# Patient Record
Sex: Female | Born: 1935 | Race: White | Hispanic: No | Smoking: Never smoker
Health system: Southern US, Community
[De-identification: ages and names within clinical notes are randomized; demographics above are authoritative.]

## PROBLEM LIST (undated history)

## (undated) DIAGNOSIS — E7800 Pure hypercholesterolemia, unspecified: Secondary | ICD-10-CM

## (undated) DIAGNOSIS — E039 Hypothyroidism, unspecified: Secondary | ICD-10-CM

## (undated) DIAGNOSIS — M1990 Unspecified osteoarthritis, unspecified site: Secondary | ICD-10-CM

## (undated) DIAGNOSIS — R450 Nervousness: Secondary | ICD-10-CM

## (undated) DIAGNOSIS — C4339 Malignant melanoma of other parts of face: Secondary | ICD-10-CM

## (undated) DIAGNOSIS — T8859XA Other complications of anesthesia, initial encounter: Secondary | ICD-10-CM

## (undated) DIAGNOSIS — L659 Nonscarring hair loss, unspecified: Secondary | ICD-10-CM

## (undated) DIAGNOSIS — M810 Age-related osteoporosis without current pathological fracture: Secondary | ICD-10-CM

## (undated) DIAGNOSIS — I10 Essential (primary) hypertension: Secondary | ICD-10-CM

## (undated) DIAGNOSIS — K219 Gastro-esophageal reflux disease without esophagitis: Secondary | ICD-10-CM

## (undated) DIAGNOSIS — H3530 Unspecified macular degeneration: Secondary | ICD-10-CM

## (undated) DIAGNOSIS — T4145XA Adverse effect of unspecified anesthetic, initial encounter: Secondary | ICD-10-CM

## (undated) DIAGNOSIS — C801 Malignant (primary) neoplasm, unspecified: Secondary | ICD-10-CM

## (undated) DIAGNOSIS — H9190 Unspecified hearing loss, unspecified ear: Secondary | ICD-10-CM

## (undated) DIAGNOSIS — F0390 Unspecified dementia without behavioral disturbance: Secondary | ICD-10-CM

## (undated) DIAGNOSIS — M889 Osteitis deformans of unspecified bone: Secondary | ICD-10-CM

## (undated) DIAGNOSIS — Z8719 Personal history of other diseases of the digestive system: Secondary | ICD-10-CM

## (undated) DIAGNOSIS — D649 Anemia, unspecified: Secondary | ICD-10-CM

## (undated) HISTORY — PX: CATARACT EXTRACTION W/ INTRAOCULAR LENS  IMPLANT, BILATERAL: SHX1307

## (undated) HISTORY — PX: OVARIAN CYST REMOVAL: SHX89

## (undated) HISTORY — PX: BREAST SURGERY: SHX581

## (undated) HISTORY — PX: JOINT REPLACEMENT: SHX530

## (undated) HISTORY — PX: EYE SURGERY: SHX253

## (undated) HISTORY — PX: APPENDECTOMY: SHX54

---

## 1990-02-24 DIAGNOSIS — C801 Malignant (primary) neoplasm, unspecified: Secondary | ICD-10-CM

## 1990-02-24 HISTORY — DX: Malignant (primary) neoplasm, unspecified: C80.1

## 2004-05-09 ENCOUNTER — Ambulatory Visit: Payer: Self-pay | Admitting: Family Medicine

## 2004-07-10 ENCOUNTER — Ambulatory Visit: Payer: Self-pay | Admitting: Family Medicine

## 2004-08-13 ENCOUNTER — Ambulatory Visit: Payer: Self-pay | Admitting: Gastroenterology

## 2005-04-02 ENCOUNTER — Ambulatory Visit: Payer: Self-pay | Admitting: Neurosurgery

## 2005-07-07 ENCOUNTER — Ambulatory Visit: Payer: Self-pay | Admitting: Internal Medicine

## 2005-08-13 ENCOUNTER — Ambulatory Visit: Payer: Self-pay | Admitting: Internal Medicine

## 2005-12-30 ENCOUNTER — Ambulatory Visit: Payer: Self-pay | Admitting: Internal Medicine

## 2006-01-05 ENCOUNTER — Ambulatory Visit: Payer: Self-pay | Admitting: Internal Medicine

## 2006-02-26 ENCOUNTER — Ambulatory Visit: Payer: Self-pay | Admitting: General Practice

## 2006-04-15 ENCOUNTER — Ambulatory Visit: Payer: Self-pay | Admitting: Gastroenterology

## 2006-08-19 ENCOUNTER — Ambulatory Visit: Payer: Self-pay | Admitting: Internal Medicine

## 2006-08-26 ENCOUNTER — Ambulatory Visit: Payer: Self-pay | Admitting: Internal Medicine

## 2007-09-06 ENCOUNTER — Ambulatory Visit: Payer: Self-pay | Admitting: Internal Medicine

## 2007-10-19 ENCOUNTER — Ambulatory Visit: Payer: Self-pay | Admitting: Unknown Physician Specialty

## 2008-03-08 ENCOUNTER — Ambulatory Visit: Payer: Self-pay | Admitting: Internal Medicine

## 2008-03-15 ENCOUNTER — Ambulatory Visit: Payer: Self-pay | Admitting: Internal Medicine

## 2008-09-07 ENCOUNTER — Ambulatory Visit: Payer: Self-pay | Admitting: Internal Medicine

## 2009-05-17 ENCOUNTER — Inpatient Hospital Stay (HOSPITAL_COMMUNITY): Admission: RE | Admit: 2009-05-17 | Discharge: 2009-05-21 | Payer: Self-pay | Admitting: Orthopedic Surgery

## 2009-05-18 ENCOUNTER — Encounter (INDEPENDENT_AMBULATORY_CARE_PROVIDER_SITE_OTHER): Payer: Self-pay | Admitting: Orthopedic Surgery

## 2009-05-18 ENCOUNTER — Ambulatory Visit: Payer: Self-pay | Admitting: Vascular Surgery

## 2009-09-12 ENCOUNTER — Ambulatory Visit: Payer: Self-pay | Admitting: Internal Medicine

## 2010-04-29 ENCOUNTER — Ambulatory Visit
Admission: RE | Admit: 2010-04-29 | Discharge: 2010-04-29 | Disposition: A | Discharge status: Home / Self Care | Payer: Medicare Other | Source: Ambulatory Visit | Attending: Orthopedic Surgery | Admitting: Orthopedic Surgery

## 2010-04-29 ENCOUNTER — Other Ambulatory Visit: Payer: Self-pay | Admitting: Orthopedic Surgery

## 2010-04-29 DIAGNOSIS — M549 Dorsalgia, unspecified: Secondary | ICD-10-CM

## 2010-05-20 LAB — URINALYSIS, ROUTINE W REFLEX MICROSCOPIC
Glucose, UA: NEGATIVE mg/dL
Protein, ur: NEGATIVE mg/dL
Specific Gravity, Urine: 1.007 (ref 1.005–1.030)
Urobilinogen, UA: 0.2 mg/dL (ref 0.0–1.0)

## 2010-05-20 LAB — BASIC METABOLIC PANEL
CO2: 29 mEq/L (ref 19–32)
Calcium: 8.2 mg/dL — ABNORMAL LOW (ref 8.4–10.5)
Calcium: 8.3 mg/dL — ABNORMAL LOW (ref 8.4–10.5)
Chloride: 108 mEq/L (ref 96–112)
Creatinine, Ser: 1.01 mg/dL (ref 0.4–1.2)
GFR calc Af Amer: >60 mL/min (ref >60)
GFR calc Af Amer: >60 mL/min (ref >60)
GFR calc non Af Amer: 52 mL/min — ABNORMAL LOW (ref >60)
Glucose, Bld: 108 mg/dL — ABNORMAL HIGH (ref 70–99)
Potassium: 4.5 mEq/L (ref 3.5–5.1)
Sodium: 137 mEq/L (ref 135–145)
Sodium: 139 mEq/L (ref 135–145)

## 2010-05-20 LAB — CBC
HCT: 27.3 % — ABNORMAL LOW (ref 36.0–46.0)
Hemoglobin: 9.4 g/dL — ABNORMAL LOW (ref 12.0–15.0)
Hemoglobin: 9.7 g/dL — ABNORMAL LOW (ref 12.0–15.0)
MCHC: 34.3 g/dL (ref 30.0–36.0)
MCHC: 34.3 g/dL (ref 30.0–36.0)
MCV: 95.9 fL (ref 78.0–100.0)
Platelets: 285 10*3/uL (ref 150–400)
RBC: 2.76 MIL/uL — ABNORMAL LOW (ref 3.87–5.11)
RBC: 2.88 MIL/uL — ABNORMAL LOW (ref 3.87–5.11)
RBC: 3.01 MIL/uL — ABNORMAL LOW (ref 3.87–5.11)
RDW: 13.2 % (ref 11.5–15.5)
WBC: 6.4 10*3/uL (ref 4.0–10.5)
WBC: 7.4 10*3/uL (ref 4.0–10.5)

## 2010-05-20 LAB — CROSSMATCH: ABO/RH(D): AB POS

## 2010-05-20 LAB — COMPREHENSIVE METABOLIC PANEL
ALT: 16 U/L (ref 0–35)
AST: 18 U/L (ref 0–37)
Albumin: 4.1 g/dL (ref 3.5–5.2)
Alkaline Phosphatase: 57 U/L (ref 39–117)
Chloride: 104 mEq/L (ref 96–112)
GFR calc Af Amer: >60 mL/min (ref >60)
Potassium: 4.7 mEq/L (ref 3.5–5.1)
Total Bilirubin: 0.7 mg/dL (ref 0.3–1.2)

## 2010-05-20 LAB — DIFFERENTIAL
Basophils Absolute: 0 10*3/uL (ref 0.0–0.1)
Basophils Relative: 0 % (ref 0–1)
Eosinophils Relative: 1 % (ref 0–5)
Monocytes Absolute: 0.4 10*3/uL (ref 0.1–1.0)

## 2010-05-20 LAB — ABO/RH: ABO/RH(D): AB POS

## 2010-05-20 LAB — APTT: aPTT: 39 seconds — ABNORMAL HIGH (ref 24–37)

## 2010-07-30 ENCOUNTER — Emergency Department: Payer: Self-pay | Admitting: Emergency Medicine

## 2010-08-20 ENCOUNTER — Ambulatory Visit: Payer: Self-pay | Admitting: Otolaryngology

## 2010-09-16 ENCOUNTER — Ambulatory Visit: Payer: Self-pay | Admitting: Internal Medicine

## 2012-05-21 ENCOUNTER — Other Ambulatory Visit: Payer: Self-pay | Admitting: Urology

## 2012-05-25 ENCOUNTER — Encounter (HOSPITAL_COMMUNITY): Payer: Self-pay

## 2012-06-04 NOTE — Patient Instructions (Addendum)
20 Darlene Paul  06/04/2012   Your procedure is scheduled on:  06/14/12  MONDAY  Report to [facility] at   0930    AM.  Call this number if you have problems the morning of surgery: [phone]       Remember:   Do not eat food  Or drink :After Midnight. Sunday NIGHT   Take these medicines the morning of surgery with A SIP OF WATER:Levothyroxine, Amlodipine, Omeprazole   .  Contacts, dentures or partial plates can not be worn to surgery  Leave suitcase in the car. After surgery it may be brought to your room.  For patients admitted to the hospital, checkout time is 11:00 AM day of  discharge.             SPECIAL INSTRUCTIONS- SEE [place] PREPARING FOR SURGERY INSTRUCTION SHEET-     DO NOT WEAR JEWELRY, LOTIONS, POWDERS, OR PERFUMES.  WOMEN-- DO NOT SHAVE LEGS OR UNDERARMS FOR 12 HOURS BEFORE SHOWERS. MEN MAY SHAVE FACE.  Patients discharged the day of surgery will not be allowed to drive home. IF going home the day of surgery, you must have a driver and someone to stay with you for the first 24 hours  Name and phone number of your driver:        Darlene Paul  daughter                                                                Please read over the following fact sheets that you were given: MRSA Information, Incentive Spirometry Sheet, Blood Transfusion Sheet  Information                                                                                   Darlene Paul  PST 336  [id number]                 FAILURE TO FOLLOW THESE INSTRUCTIONS MAY RESULT IN  CANCELLATION   OF YOUR SURGERY                                                  Patient Signature _____________________________

## 2012-06-07 ENCOUNTER — Encounter (HOSPITAL_COMMUNITY)
Admission: RE | Admit: 2012-06-07 | Discharge: 2012-06-07 | Disposition: A | Discharge status: Home / Self Care | Payer: Medicare Other | Source: Ambulatory Visit | Attending: Urology | Admitting: Urology

## 2012-06-07 ENCOUNTER — Encounter (HOSPITAL_COMMUNITY): Payer: Self-pay

## 2012-06-07 ENCOUNTER — Ambulatory Visit (HOSPITAL_COMMUNITY)
Admission: RE | Admit: 2012-06-07 | Discharge: 2012-06-07 | Disposition: A | Discharge status: Home / Self Care | Payer: Medicare Other | Source: Ambulatory Visit | Attending: Urology | Admitting: Urology

## 2012-06-07 DIAGNOSIS — Z01812 Encounter for preprocedural laboratory examination: Secondary | ICD-10-CM | POA: Insufficient documentation

## 2012-06-07 DIAGNOSIS — Z01810 Encounter for preprocedural cardiovascular examination: Secondary | ICD-10-CM | POA: Insufficient documentation

## 2012-06-07 DIAGNOSIS — K449 Diaphragmatic hernia without obstruction or gangrene: Secondary | ICD-10-CM | POA: Insufficient documentation

## 2012-06-07 DIAGNOSIS — N329 Bladder disorder, unspecified: Secondary | ICD-10-CM | POA: Insufficient documentation

## 2012-06-07 DIAGNOSIS — I10 Essential (primary) hypertension: Secondary | ICD-10-CM | POA: Insufficient documentation

## 2012-06-07 HISTORY — DX: Unspecified macular degeneration: H35.30

## 2012-06-07 HISTORY — DX: Nonscarring hair loss, unspecified: L65.9

## 2012-06-07 HISTORY — DX: Malignant (primary) neoplasm, unspecified: C80.1

## 2012-06-07 HISTORY — DX: Personal history of other diseases of the digestive system: Z87.19

## 2012-06-07 HISTORY — DX: Unspecified hearing loss, unspecified ear: H91.90

## 2012-06-07 HISTORY — DX: Hypothyroidism, unspecified: E03.9

## 2012-06-07 HISTORY — DX: Essential (primary) hypertension: I10

## 2012-06-07 HISTORY — DX: Gastro-esophageal reflux disease without esophagitis: K21.9

## 2012-06-07 HISTORY — DX: Unspecified osteoarthritis, unspecified site: M19.90

## 2012-06-07 HISTORY — DX: Pure hypercholesterolemia, unspecified: E78.00

## 2012-06-07 LAB — CBC
HCT: 42.3 % (ref 36.0–46.0)
Hemoglobin: 14.4 g/dL (ref 12.0–15.0)
MCH: 31.6 pg (ref 26.0–34.0)
MCHC: 34 g/dL (ref 30.0–36.0)
RDW: 13.2 % (ref 11.5–15.5)

## 2012-06-07 LAB — BASIC METABOLIC PANEL
BUN: 14 mg/dL (ref 6–23)
Calcium: 9.6 mg/dL (ref 8.4–10.5)
GFR calc non Af Amer: 55 mL/min — ABNORMAL LOW (ref >90)
Glucose, Bld: 91 mg/dL (ref 70–99)
Sodium: 140 mEq/L (ref 135–145)

## 2012-06-07 LAB — SURGICAL PCR SCREEN: MRSA, PCR: NEGATIVE

## 2012-06-14 ENCOUNTER — Encounter (HOSPITAL_COMMUNITY)
Admission: RE | Disposition: A | Discharge status: Home / Self Care | Payer: Self-pay | Source: Ambulatory Visit | Attending: Urology

## 2012-06-14 ENCOUNTER — Ambulatory Visit (HOSPITAL_COMMUNITY)
Admission: RE | Admit: 2012-06-14 | Discharge: 2012-06-14 | Disposition: A | Discharge status: Home / Self Care | Payer: Medicare Other | Source: Ambulatory Visit | Attending: Urology | Admitting: Urology

## 2012-06-14 ENCOUNTER — Ambulatory Visit (HOSPITAL_COMMUNITY): Payer: Medicare Other | Admitting: Anesthesiology

## 2012-06-14 ENCOUNTER — Encounter (HOSPITAL_COMMUNITY): Payer: Self-pay | Admitting: Anesthesiology

## 2012-06-14 ENCOUNTER — Encounter (HOSPITAL_COMMUNITY): Payer: Self-pay | Admitting: *Deleted

## 2012-06-14 DIAGNOSIS — K219 Gastro-esophageal reflux disease without esophagitis: Secondary | ICD-10-CM | POA: Insufficient documentation

## 2012-06-14 DIAGNOSIS — Z853 Personal history of malignant neoplasm of breast: Secondary | ICD-10-CM | POA: Insufficient documentation

## 2012-06-14 DIAGNOSIS — N3020 Other chronic cystitis without hematuria: Secondary | ICD-10-CM | POA: Insufficient documentation

## 2012-06-14 DIAGNOSIS — M129 Arthropathy, unspecified: Secondary | ICD-10-CM | POA: Insufficient documentation

## 2012-06-14 DIAGNOSIS — Y9210 Unspecified residential institution as the place of occurrence of the external cause: Secondary | ICD-10-CM | POA: Insufficient documentation

## 2012-06-14 DIAGNOSIS — IMO0002 Reserved for concepts with insufficient information to code with codable children: Secondary | ICD-10-CM | POA: Insufficient documentation

## 2012-06-14 DIAGNOSIS — E7800 Pure hypercholesterolemia, unspecified: Secondary | ICD-10-CM | POA: Insufficient documentation

## 2012-06-14 DIAGNOSIS — N329 Bladder disorder, unspecified: Secondary | ICD-10-CM

## 2012-06-14 DIAGNOSIS — Z882 Allergy status to sulfonamides status: Secondary | ICD-10-CM | POA: Insufficient documentation

## 2012-06-14 DIAGNOSIS — I10 Essential (primary) hypertension: Secondary | ICD-10-CM | POA: Insufficient documentation

## 2012-06-14 DIAGNOSIS — Z883 Allergy status to other anti-infective agents status: Secondary | ICD-10-CM | POA: Insufficient documentation

## 2012-06-14 DIAGNOSIS — E039 Hypothyroidism, unspecified: Secondary | ICD-10-CM | POA: Insufficient documentation

## 2012-06-14 HISTORY — PX: CYSTOSCOPY W/ RETROGRADES: SHX1426

## 2012-06-14 HISTORY — PX: CYSTOGRAM: SHX6285

## 2012-06-14 HISTORY — DX: Adverse effect of unspecified anesthetic, initial encounter: T41.45XA

## 2012-06-14 HISTORY — PX: CYSTOSCOPY WITH BIOPSY: SHX5122

## 2012-06-14 HISTORY — DX: Other complications of anesthesia, initial encounter: T88.59XA

## 2012-06-14 SURGERY — CYSTOSCOPY, WITH BIOPSY
Anesthesia: Monitor Anesthesia Care | Site: Bladder | Wound class: Clean Contaminated

## 2012-06-14 MED ORDER — CEFAZOLIN SODIUM-DEXTROSE 2-3 GM-% IV SOLR
INTRAVENOUS | Status: AC
  Filled 2012-06-14: qty 50

## 2012-06-14 MED ORDER — SENNOSIDES-DOCUSATE SODIUM 8.6-50 MG PO TABS: 1.0000 | ORAL_TABLET | Freq: Two times a day (BID) | ORAL | Status: DC

## 2012-06-14 MED ORDER — IOHEXOL 300 MG/ML  SOLN
INTRAMUSCULAR | Status: DC | PRN
  Administered 2012-06-14: 10 mL via INTRAVENOUS

## 2012-06-14 MED ORDER — HYDROMORPHONE HCL PF 1 MG/ML IJ SOLN
0.2500 mg | INTRAMUSCULAR | Status: DC | PRN
  Administered 2012-06-14 (×4): 0.25 mg via INTRAVENOUS

## 2012-06-14 MED ORDER — HYDROMORPHONE HCL PF 1 MG/ML IJ SOLN
INTRAMUSCULAR | Status: AC
  Filled 2012-06-14: qty 1

## 2012-06-14 MED ORDER — CEFAZOLIN SODIUM-DEXTROSE 2-3 GM-% IV SOLR
2.0000 g | INTRAVENOUS | Status: AC
  Administered 2012-06-14: 2 g via INTRAVENOUS

## 2012-06-14 MED ORDER — HYOSCYAMINE SULFATE 0.125 MG PO TABS: 0.1250 mg | ORAL_TABLET | ORAL | Status: DC | PRN

## 2012-06-14 MED ORDER — ACETAMINOPHEN 10 MG/ML IV SOLN: 1000.0000 mg | Freq: Once | INTRAVENOUS | Status: DC | PRN

## 2012-06-14 MED ORDER — MEPERIDINE HCL 50 MG/ML IJ SOLN: 6.2500 mg | INTRAMUSCULAR | Status: DC | PRN

## 2012-06-14 MED ORDER — LIDOCAINE HCL 2 % EX GEL
CUTANEOUS | Status: AC
  Filled 2012-06-14: qty 10

## 2012-06-14 MED ORDER — PROPOFOL 10 MG/ML IV EMUL
INTRAVENOUS | Status: DC | PRN
  Administered 2012-06-14: 120 ug/kg/min via INTRAVENOUS

## 2012-06-14 MED ORDER — ACETAMINOPHEN 10 MG/ML IV SOLN
INTRAVENOUS | Status: AC
  Filled 2012-06-14: qty 100

## 2012-06-14 MED ORDER — OXYCODONE HCL 5 MG PO TABS: 5.0000 mg | ORAL_TABLET | Freq: Once | ORAL | Status: DC | PRN

## 2012-06-14 MED ORDER — OXYCODONE HCL 5 MG/5ML PO SOLN
5.0000 mg | Freq: Once | ORAL | Status: DC | PRN
  Filled 2012-06-14: qty 5

## 2012-06-14 MED ORDER — IOHEXOL 300 MG/ML  SOLN
INTRAMUSCULAR | Status: AC
  Filled 2012-06-14: qty 1

## 2012-06-14 MED ORDER — LIDOCAINE HCL 2 % EX GEL: CUTANEOUS | Status: DC | PRN

## 2012-06-14 MED ORDER — SODIUM CHLORIDE 0.9 % IR SOLN
Status: DC | PRN
  Administered 2012-06-14: 1000 mL

## 2012-06-14 MED ORDER — LACTATED RINGERS IV SOLN
INTRAVENOUS | Status: DC
  Administered 2012-06-14: 1000 mL via INTRAVENOUS

## 2012-06-14 MED ORDER — HYDROCODONE-ACETAMINOPHEN 5-325 MG PO TABS: 1.0000 | ORAL_TABLET | ORAL | Status: DC | PRN

## 2012-06-14 MED ORDER — BELLADONNA ALKALOIDS-OPIUM 16.2-60 MG RE SUPP
RECTAL | Status: AC
  Filled 2012-06-14: qty 1

## 2012-06-14 MED ORDER — CEPHALEXIN 500 MG PO CAPS: 500.0000 mg | ORAL_CAPSULE | Freq: Three times a day (TID) | ORAL | Status: DC

## 2012-06-14 MED ORDER — FENTANYL CITRATE 0.05 MG/ML IJ SOLN
INTRAMUSCULAR | Status: DC | PRN
  Administered 2012-06-14 (×2): 25 ug via INTRAVENOUS
  Administered 2012-06-14: 50 ug via INTRAVENOUS

## 2012-06-14 MED ORDER — ONDANSETRON HCL 4 MG/2ML IJ SOLN
INTRAMUSCULAR | Status: DC | PRN
  Administered 2012-06-14: 4 mg via INTRAVENOUS

## 2012-06-14 MED ORDER — OXYBUTYNIN CHLORIDE 5 MG PO TABS: 5.0000 mg | ORAL_TABLET | Freq: Four times a day (QID) | ORAL | Status: DC | PRN

## 2012-06-14 MED ORDER — BELLADONNA ALKALOIDS-OPIUM 16.2-60 MG RE SUPP
RECTAL | Status: DC | PRN
  Administered 2012-06-14: 1 via RECTAL

## 2012-06-14 MED ORDER — ACETAMINOPHEN 10 MG/ML IV SOLN
INTRAVENOUS | Status: DC | PRN
  Administered 2012-06-14: 1000 mg via INTRAVENOUS

## 2012-06-14 MED ORDER — PROMETHAZINE HCL 25 MG/ML IJ SOLN: 6.2500 mg | INTRAMUSCULAR | Status: DC | PRN

## 2012-06-14 MED ORDER — LIDOCAINE HCL (CARDIAC) 20 MG/ML IV SOLN
INTRAVENOUS | Status: DC | PRN
  Administered 2012-06-14: 100 mg via INTRAVENOUS

## 2012-06-14 SURGICAL SUPPLY — 26 items
ADAPTER CATH URET PLST 4-6FR (CATHETERS) ×2 IMPLANT
BAG URINE DRAINAGE (UROLOGICAL SUPPLIES) ×2 IMPLANT
BAG URO CATCHER STRL LF (DRAPE) ×2 IMPLANT
BASKET ZERO TIP NITINOL 2.4FR (BASKET) IMPLANT
CATH FOLEY 2WAY SLVR 30CC 20FR (CATHETERS) ×2 IMPLANT
CATH ROBINSON RED A/P 16FR (CATHETERS) IMPLANT
CATH URET 5FR 28IN OPEN ENDED (CATHETERS) IMPLANT
CLOTH BEACON ORANGE TIMEOUT ST (SAFETY) ×2 IMPLANT
CYSTOGRAFIN 30% 250ML (MISCELLANEOUS) ×2 IMPLANT
DRAPE CAMERA CLOSED 9X96 (DRAPES) ×2 IMPLANT
ELECT REM PT RETURN 9FT ADLT (ELECTROSURGICAL) ×2
ELECTRODE REM PT RTRN 9FT ADLT (ELECTROSURGICAL) ×1 IMPLANT
GLOVE BIOGEL M 7.0 STRL (GLOVE) ×2 IMPLANT
GOWN PREVENTION PLUS XLARGE (GOWN DISPOSABLE) ×2 IMPLANT
GOWN STRL NON-REIN LRG LVL3 (GOWN DISPOSABLE) ×2 IMPLANT
GUIDEWIRE ANG ZIPWIRE 038X150 (WIRE) IMPLANT
GUIDEWIRE STR DUAL SENSOR (WIRE) ×2 IMPLANT
MANIFOLD NEPTUNE II (INSTRUMENTS) ×2 IMPLANT
NDL SAFETY ECLIPSE 18X1.5 (NEEDLE) IMPLANT
NEEDLE HYPO 18GX1.5 SHARP (NEEDLE)
NEEDLE HYPO 22GX1.5 SAFETY (NEEDLE) IMPLANT
PACK CYSTO (CUSTOM PROCEDURE TRAY) ×2 IMPLANT
SCRUB PCMX 4 OZ (MISCELLANEOUS) ×2 IMPLANT
SYRINGE IRR TOOMEY STRL 70CC (SYRINGE) ×2 IMPLANT
TUBING CONNECTING 10 (TUBING) ×2 IMPLANT
WATER STERILE IRR 3000ML UROMA (IV SOLUTION) ×2 IMPLANT

## 2012-06-14 NOTE — Anesthesia Postprocedure Evaluation (Signed)
Anesthesia Post Note  Patient: Darlene Paul  Procedure(s) Performed: Procedure(s) (LRB): CYSTOSCOPY WITH BIOPSY (N/A) CYSTOSCOPY WITH RETROGRADE PYELOGRAM (N/A) CYSTOGRAM (N/A)  Anesthesia type: MAC  Patient location: PACU  Post pain: Pain level controlled  Post assessment: Post-op Vital signs reviewed  Last Vitals: BP 168/60  Pulse 60  Temp(Src) 36.5 C (Oral)  Resp 16  SpO2 98%  Post vital signs: Reviewed  Level of consciousness: awake  Complications: No apparent anesthesia complications

## 2012-06-14 NOTE — H&P (Signed)
Urology History and Physical Exam  CC: Bladder lesion  HPI: 79 year old female presents today for cystoscopy, bladder biopsy, and bilateral retrograde pyelograms due to a bladder lesion. This was discovered 05/20/12 during office cystoscopy. It is associated with dysuria. It is less than 1 cm in size. It is located along the posterior bladder wall. It is not papillary in nature. It is not associated with gross hematuria. She was exposed to 2nd hand smoke for 12 years. No history of smoking, exposure to industrial chemicals, or chemotherapy. I have carefully explained that this procedure will not improve her dysuria. UA in clinic was negative for signs of infection.  We have discussed the the risks of the procedure which include, but are not limited to, heart attack, stroke, pulmonary embolus, pneumonia, death, positioning injury, permanent or temporary neuropathy, bleeding, infection, need for further procedure, damage to contiguous structures, need for foley catheter, and bladder perforation. We discussed the possibility of the need for ureteroscopy bilaterally with the need for biopsy and ureter stent placement if the retrograde pyelograms are concerning for filling defects. We discussed these risk include, but are not limited to, ureter avulsion, ureter stricture, inability to access ureter with ureteroscope, and need for further procedure.  PMH: Past Medical History  Diagnosis Date  . Arthritis   . Hypertension   . Hypercholesterolemia   . GERD (gastroesophageal reflux disease)   . H/O hiatal hernia   . Hard of hearing   . Macular degeneration of both eyes     injection  right eye 05/31/12  . Alopecia of scalp   . Cancer 1992    right breast  . Hypothyroidism     "nodules x 3"- s/p Radioactive Iodine Therapy    PSH: Past Surgical History  Procedure Laterality Date  . Eye surgery Bilateral     cataract extraction with IOL  . Ovarian cyst removal    . Appendectomy    . Joint  replacement Left     knee  . Breast surgery      right mastectomy with node dissection    Allergies: Allergies  Allergen Reactions  . Azithromycin Palpitations  . Sulfa Antibiotics Rash    Medications: No prescriptions prior to admission     Social History: History   Social History  . Marital Status: Widowed    Spouse Name: N/A    Number of Children: N/A  . Years of Education: N/A   Occupational History  . Not on file.   Social History Main Topics  . Smoking status: Never Smoker   . Smokeless tobacco: Never Used  . Alcohol Use: No  . Drug Use: No  . Sexually Active: Not on file   Other Topics Concern  . Not on file   Social History Narrative  . No narrative on file    Family History: No family history on file.  Review of Systems: Positive: Dysuria Negative: Fever, chest pain, or SOB.  A further 10 point review of systems was negative except what is listed in the HPI.  Physical Exam: Filed Vitals:   06/14/12 0912  BP: 166/82  Pulse: 79  Temp: 97.3 F (36.3 C)  Resp: 18    General: No acute distress.  Awake. Head:  Normocephalic.  Atraumatic. ENT:  EOMI.  Mucous membranes moist Neck:  Supple.  No lymphadenopathy. CV:  S1 present. S2 present. Regular rate. Pulmonary: Equal effort bilaterally.  Clear to auscultation bilaterally. Abdomen: Soft.  Non- tender to palpation. Skin:  Normal turgor.  No visible rash. Extremity: No gross deformity of bilateral upper extremities.  No gross deformity of    bilateral lower extremities. Neurologic: Alert. Appropriate mood.    Studies:  No results found for this basename: HGB, WBC, PLT,  in the last 72 hours  No results found for this basename: NA, K, CL, CO2, BUN, CREATININE, CALCIUM, MAGNESIUM, GFRNONAA, GFRAA,  in the last 72 hours   No results found for this basename: PT, INR, APTT,  in the last 72 hours   No components found with this basename: ABG,     Assessment:  Bladder lesion  Plan: To  OR for cystoscopy, bladder biopsy, and bilateral retrograde pyelograms.

## 2012-06-14 NOTE — Op Note (Signed)
Urology Operative Report  Date of Procedure: 06/14/12  Surgeon: Natalia Leatherwood, MD Assistant: None  Preoperative Diagnosis: Bladder lesion. Postoperative Diagnosis: Bladder lesion. Extraperitoneal bladder perforation.  Procedure(s): Cystoscopy Bladder biopsy Bilateral retrograde pyelogram On-table cystogram Foley catheter placement  Estimated blood loss: Minimal  Specimen: Bladder biopsy (posterior left, posterior right)  Drains: Foley (20Fr)  Complications: None  Findings: Extraperitoneal bladder perforation. Negative filling defects on retrograde pyelograms. Small erythematous area on posterior bladder wall.  History of present illness: 79 year old female presents today for bladder lesions that were discovered on office cystoscopy due to her history of dysuria. She presents for bladder biopsy, bilateral retrograde pyelograms appear   Procedure in detail: After informed consent was obtained, the patient was taken to the operating room. They were placed in the supine position. SCDs were turned on and in place. IV antibiotics were infused, and monitored anesthesia with IV sedation was induced. A timeout was performed in which the correct patient, surgical site, and procedure were identified and agreed upon by the team. A belladonna and opium suppository was placed into the rectum.  The patient was placed in a dorsolithotomy position, making sure to pad all pertinent neurovascular pressure points. The genitals were prepped and draped in the usual sterile fashion.  A cystoscope was placed through the urethra and into the bladder. The bladder was drained and then filled with sterile fluid and evaluated in a systematic fashion with a 30 and 70 lens. There were noted to be 2 erythematous lesions on the posterior bladder wall; one on the right side and one on the left side. There were no papillary tumors noted. A cold cup biopsy forcep was used to biopsy each of these areas. These  were sent for pathology. Bugbee electrode was used to fulgurate the biopsy sites and the area surrounding the sites. There was good hemostasis. I did note that in the biopsy on the left side I could see some fat, but there was muscle fibers beyond this fat.  Attention was turned to the right and left ureter orifice. These were cannulated with a 5 French ureter catheter and I injected contrast to obtain retrograde pyelograms. There were no filling defects bilaterally. There were noted to be air bubbles on both sides that were confirmed to be air bubbles by flushing in and out with contrast.  I then drained the bladder and placed a 20 French Foley catheter with 10 cc of sterile water. I performed an on table cystogram by first taking an x-ray without contrast. I then gravity filled the bladder with 300 cc of Cystografin. I obtained an x-ray which appeared to show a small amount of extravasation on the left posterior bladder that was extraperitoneal. The bladder was then drained and flushed and this confirmed an extraperitoneal bladder perforation. Because of this I left the Foley catheter in place.  The patient was placed back in a supine position, anesthesia was reversed, and she was taken to the PACU in stable condition. She will be given a prescription for Keflex while she has the catheter in place. She'll return to my clinic 06/25/12 at 3 PM for cystogram prior to voiding trial.

## 2012-06-14 NOTE — Transfer of Care (Signed)
Immediate Anesthesia Transfer of Care Note  Patient: Lilli L Kinnamon  Procedure(s) Performed: Procedure(s) (LRB): CYSTOSCOPY WITH BIOPSY (N/A) CYSTOSCOPY WITH RETROGRADE PYELOGRAM (N/A) CYSTOGRAM (N/A)  Patient Location: PACU  Anesthesia Type: MAC  Level of Consciousness: sedated, patient cooperative and responds to stimulaton  Airway & Oxygen Therapy: Patient Spontanous Breathing and Patient connected to face mask oxgen  Post-op Assessment: Report given to PACU RN and Post -op Vital signs reviewed and stable  Post vital signs: Reviewed and stable  Complications: No apparent anesthesia complications

## 2012-06-14 NOTE — Anesthesia Preprocedure Evaluation (Addendum)
Anesthesia Evaluation  Patient identified by MRN, date of birth, ID band Patient awake    Reviewed: Allergy & Precautions, H&P , NPO status , Patient's Chart, lab work & pertinent test results  Airway Mallampati: II TM Distance: >3 FB Neck ROM: Full    Dental  (+) Dental Advisory Given   Pulmonary neg pulmonary ROS,  breath sounds clear to auscultation        Cardiovascular hypertension, Pt. on medications Rhythm:Regular Rate:Normal     Neuro/Psych negative neurological ROS  negative psych ROS   GI/Hepatic Neg liver ROS, hiatal hernia, GERD-  Medicated,  Endo/Other  Hypothyroidism   Renal/GU negative Renal ROS     Musculoskeletal negative musculoskeletal ROS (+)   Abdominal   Peds  Hematology negative hematology ROS (+)   Anesthesia Other Findings   Reproductive/Obstetrics                          Anesthesia Physical Anesthesia Plan  ASA: II  Anesthesia Plan: MAC   Post-op Pain Management:    Induction: Intravenous  Airway Management Planned:   Additional Equipment:   Intra-op Plan:   Post-operative Plan:   Informed Consent: I have reviewed the patients History and Physical, chart, labs and discussed the procedure including the risks, benefits and alternatives for the proposed anesthesia with the patient or authorized representative who has indicated his/her understanding and acceptance.   Dental advisory given  Plan Discussed with: CRNA  Anesthesia Plan Comments:         Anesthesia Quick Evaluation

## 2012-06-14 NOTE — Preoperative (Signed)
Beta Blockers   Reason not to administer Beta Blockers:Not Applicable 

## 2012-06-15 ENCOUNTER — Encounter (HOSPITAL_COMMUNITY): Payer: Self-pay | Admitting: Urology

## 2012-07-21 ENCOUNTER — Ambulatory Visit: Payer: Self-pay | Admitting: Internal Medicine

## 2012-07-27 ENCOUNTER — Ambulatory Visit: Payer: Self-pay | Admitting: Internal Medicine

## 2013-07-04 ENCOUNTER — Ambulatory Visit: Payer: Self-pay | Admitting: Internal Medicine

## 2013-10-07 ENCOUNTER — Ambulatory Visit: Payer: Self-pay | Admitting: Internal Medicine

## 2014-03-17 IMAGING — CT CT ABDOMEN WO/W CM
2 of 4 series · 14 of 32 positions shown, 19 images · non-contrast
Comparison: No comparison

REASON FOR EXAM: RT renal mass
COMMENTS:

PROCEDURE:     CT  - CT ABDOMEN STANDARD W/WO  - July 27, 2012  [DATE]
RESULT:     History: Renal mass
TECHNIQUE: Multiple axial images of the abdomen were performed from the lung
bases to the pubic symphysis, without p.o. contrast and prior to and
following 100 ml of [id number] intravenous contrast in the nephrographic and
excretory renal phases.

[Series 2: soft tissue w/o · axial · non-contrast · 0.68mm/px · z∈[-728,-564]mm · 6 of 89 slices shown]
[im 12/89  soft-tissue]
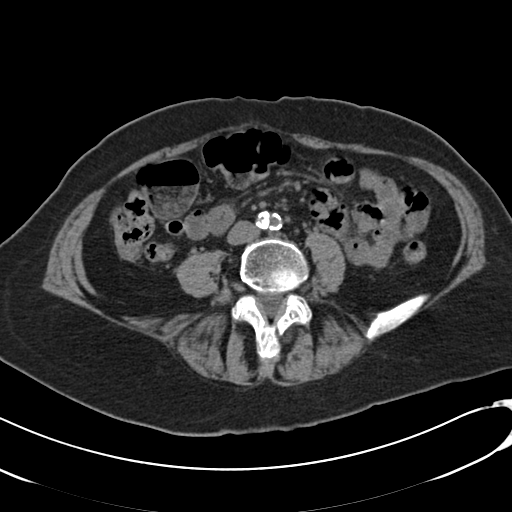
[im 23/89  soft-tissue]
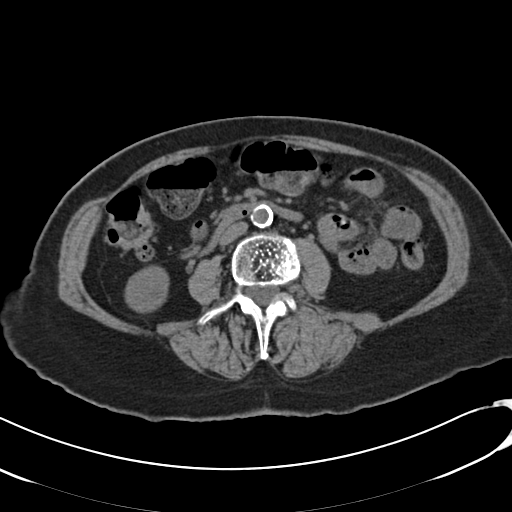
[im 34/89  soft-tissue]
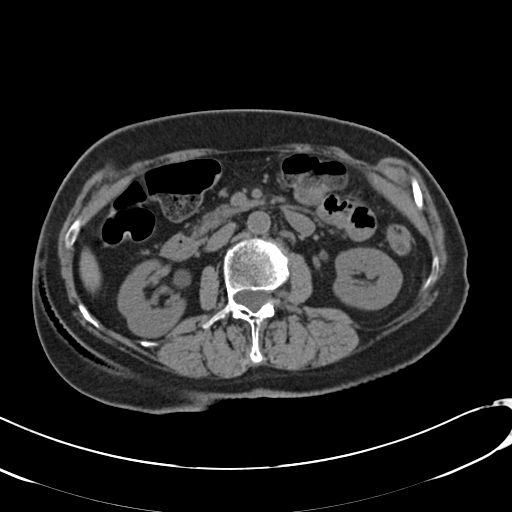
[im 45/89  soft-tissue]
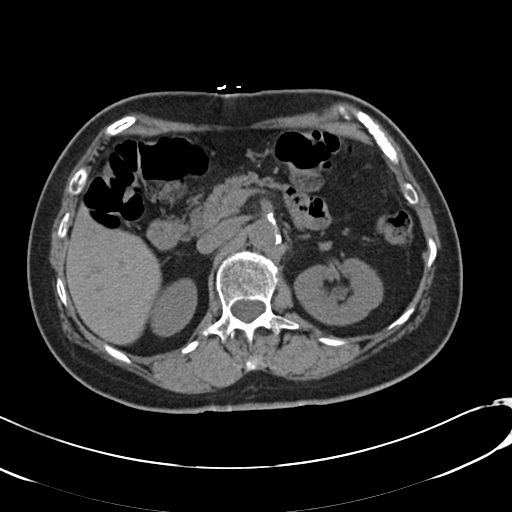
[im 56/89  soft-tissue]
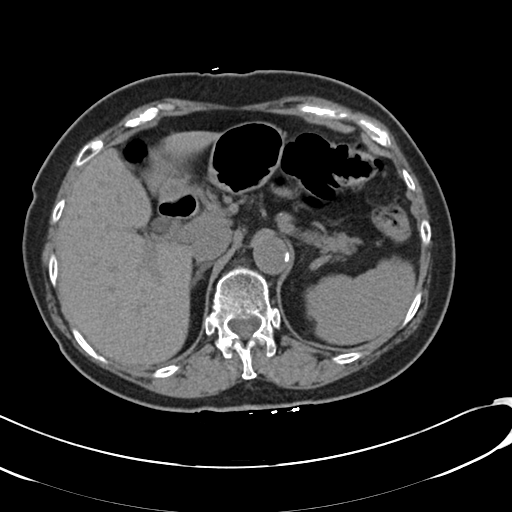
[im 67/89  soft-tissue]
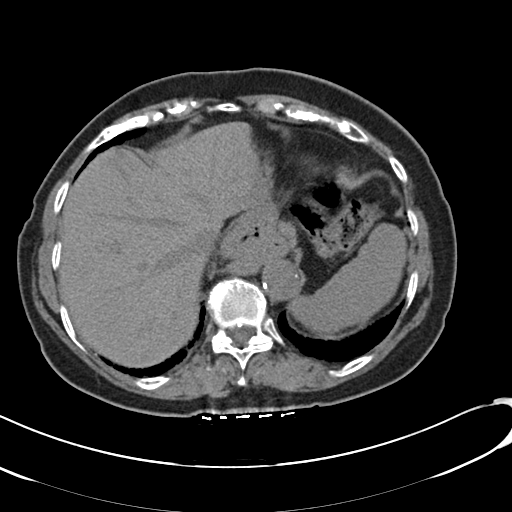

[Series 6: soft tissue delay · axial · delayed · 0.68mm/px · z∈[-734,-528]mm · 8 of 89 slices shown, 13 images]
[im 10/89  soft-tissue]
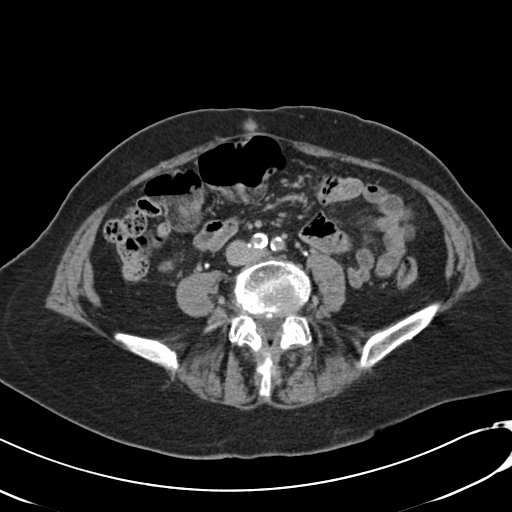
[im 10/89  bone]
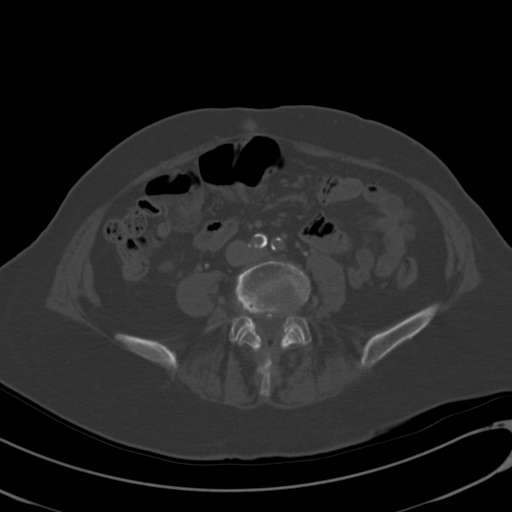
[im 20/89  soft-tissue]
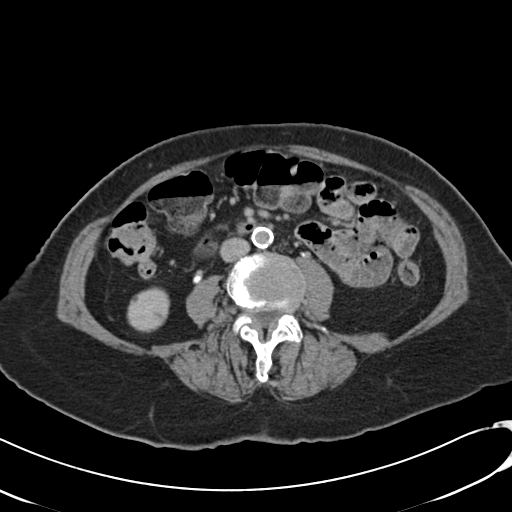
[im 30/89  soft-tissue]
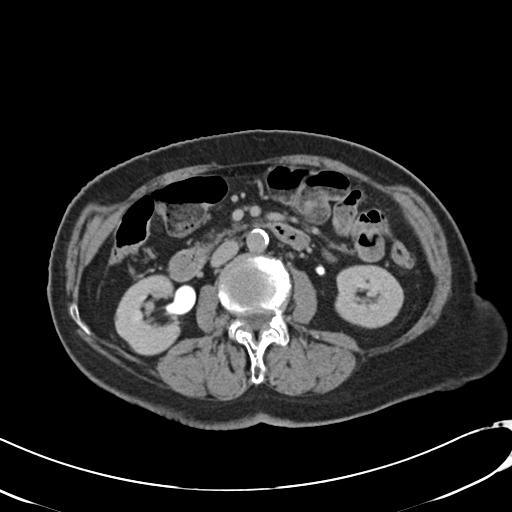
[im 40/89  soft-tissue]
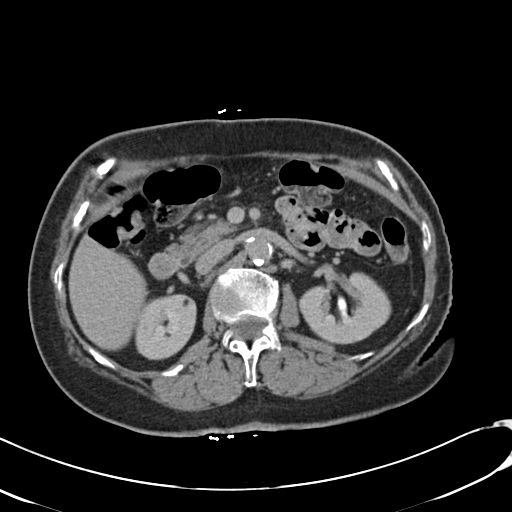
[im 49/89  soft-tissue]
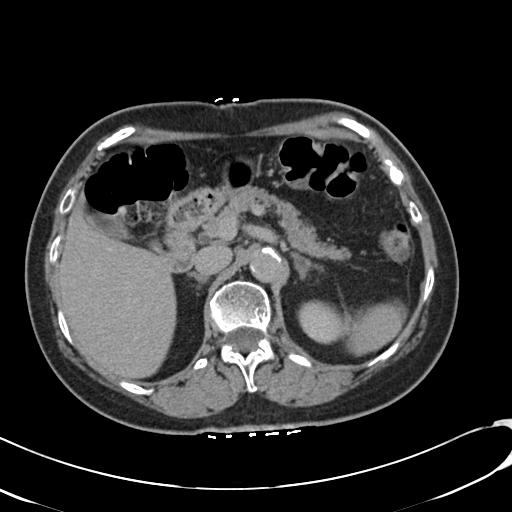
[im 49/89  lung]
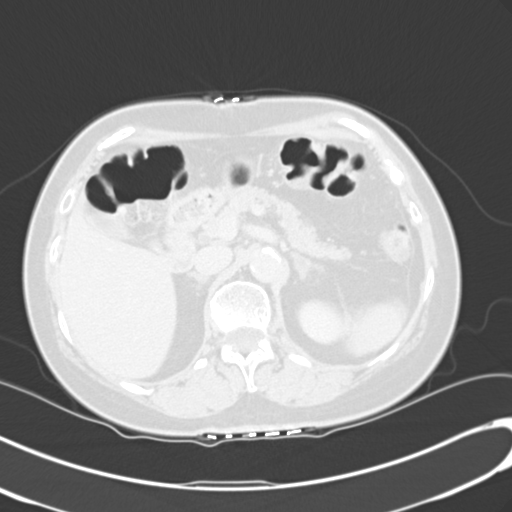
[im 59/89  soft-tissue]
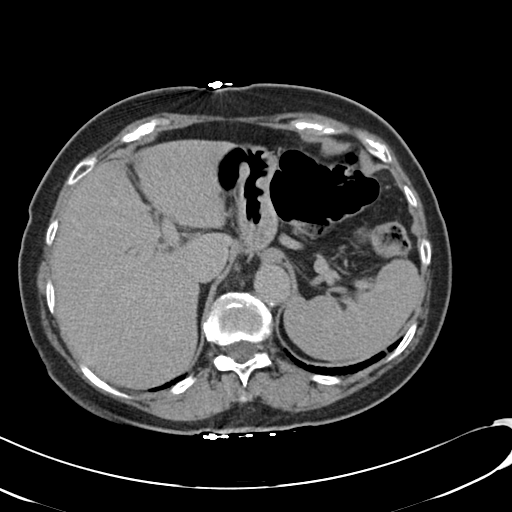
[im 59/89  lung]
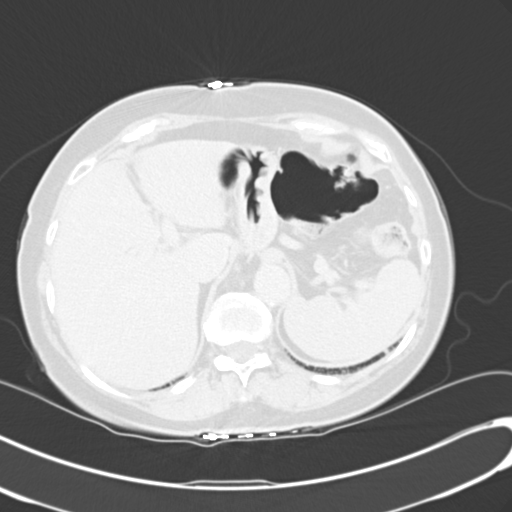
[im 69/89  soft-tissue]
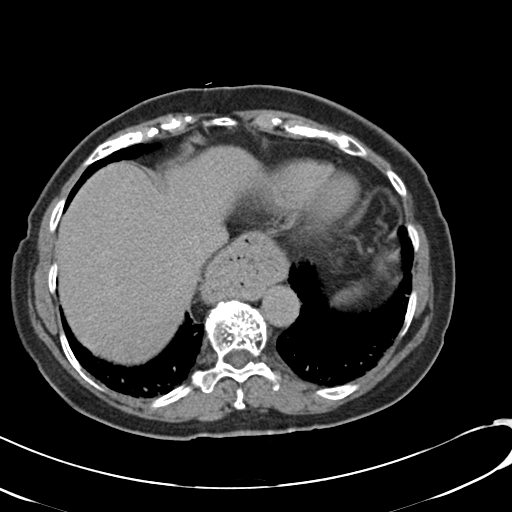
[im 69/89  lung]
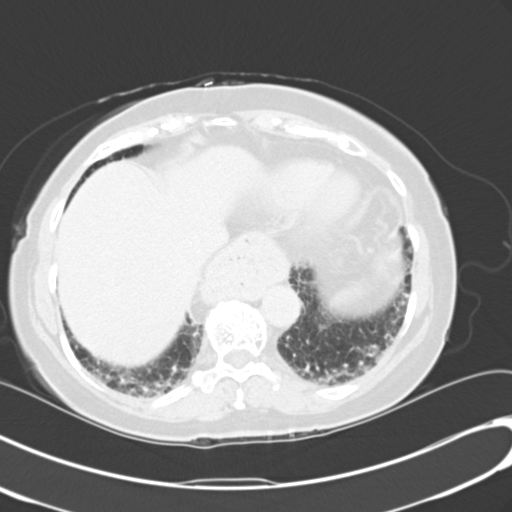
[im 79/89  soft-tissue]
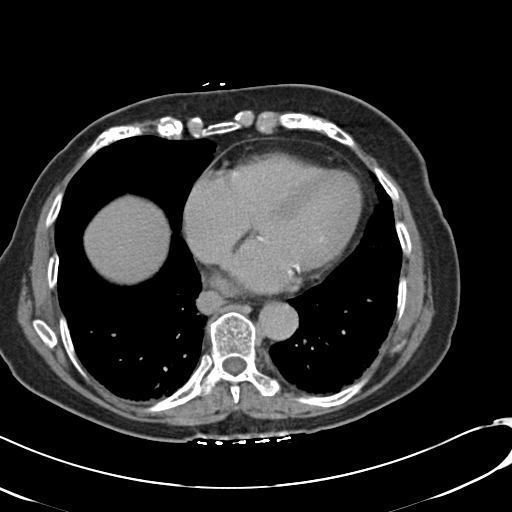
[im 79/89  lung]
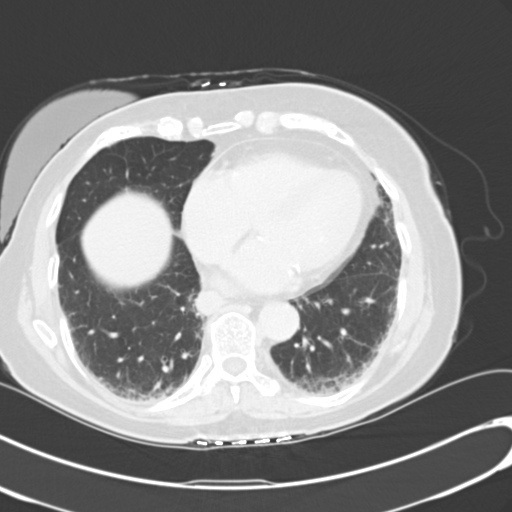

[14 of 32 positions shown; findings below may reference images not displayed]

FINDINGS: The lung bases are clear. There is no pneumothorax. The heart size is
normal.

There are no renal, ureteral, or bladder calculi. There is no obstructive
uropathy. There are a few tiny hypodensities scattered throughout bilateral
kidneys which are too small to characterize, but showed no enhancement and
likely represent tiny cysts. Bilateral kidneys enhance normally and
symmetrically. There is normal symmetric bilateral excretion of contrast
without evidence of filling defects.

The liver demonstrates no focal abnormality. There is no intrahepatic or
extrahepatic biliary ductal dilatation. The gallbladder is unremarkable. The
spleen demonstrates no focal abnormality. The  adrenal glands and pancreas
are normal.

There is a moderate size hiatal hernia. The visualized portions of the
unopacified stomach, duodenum, small intestine, and large intestine
demonstrate no gross abnormality, but evaluation is limited secondary to
lack of enteric contrast. There is no pneumoperitoneum, pneumatosis, or
portal venous gas. There is no abdominal free fluid. There is no
lymphadenopathy.

The abdominal aorta is normal in caliber.

The osseous structures are unremarkable.
IMPRESSION: 1. There are a few tiny hypodensities scattered throughout bilateral kidneys
which are too small to characterize, but showed no enhancement and likely
represent tiny cysts.

2. Moderate size hiatal hernia.

[REDACTED]

## 2015-10-07 ENCOUNTER — Encounter: Payer: Self-pay | Admitting: Emergency Medicine

## 2015-10-07 ENCOUNTER — Emergency Department: Payer: Medicare Other

## 2015-10-07 ENCOUNTER — Emergency Department
Admission: EM | Admit: 2015-10-07 | Discharge: 2015-10-07 | Disposition: A | Discharge status: Home / Self Care | Payer: Medicare Other | Attending: Student | Admitting: Student

## 2015-10-07 DIAGNOSIS — F0390 Unspecified dementia without behavioral disturbance: Secondary | ICD-10-CM | POA: Diagnosis not present

## 2015-10-07 DIAGNOSIS — E039 Hypothyroidism, unspecified: Secondary | ICD-10-CM | POA: Insufficient documentation

## 2015-10-07 DIAGNOSIS — I10 Essential (primary) hypertension: Secondary | ICD-10-CM | POA: Insufficient documentation

## 2015-10-07 DIAGNOSIS — R531 Weakness: Secondary | ICD-10-CM | POA: Insufficient documentation

## 2015-10-07 DIAGNOSIS — Z853 Personal history of malignant neoplasm of breast: Secondary | ICD-10-CM | POA: Insufficient documentation

## 2015-10-07 LAB — URINALYSIS COMPLETE WITH MICROSCOPIC (ARMC ONLY)
BILIRUBIN URINE: NEGATIVE
Bacteria, UA: NONE SEEN
Glucose, UA: NEGATIVE mg/dL
Hgb urine dipstick: NEGATIVE
KETONES UR: NEGATIVE mg/dL
LEUKOCYTES UA: NEGATIVE
Nitrite: NEGATIVE
PH: 6 (ref 5.0–8.0)
Protein, ur: 30 mg/dL — AB
SQUAMOUS EPITHELIAL / LPF: NONE SEEN
Specific Gravity, Urine: 1.019 (ref 1.005–1.030)

## 2015-10-07 LAB — BASIC METABOLIC PANEL
ANION GAP: 5 (ref 5–15)
BUN: 21 mg/dL — ABNORMAL HIGH (ref 6–20)
CHLORIDE: 106 mmol/L (ref 101–111)
CO2: 27 mmol/L (ref 22–32)
Calcium: 9.8 mg/dL (ref 8.9–10.3)
Creatinine, Ser: 0.88 mg/dL (ref 0.44–1.00)
GFR calc non Af Amer: 55 mL/min — ABNORMAL LOW (ref >60)
Glucose, Bld: 124 mg/dL — ABNORMAL HIGH (ref 65–99)
POTASSIUM: 4.2 mmol/L (ref 3.5–5.1)
SODIUM: 138 mmol/L (ref 135–145)

## 2015-10-07 LAB — TROPONIN I: Troponin I: <0.03 ng/mL (ref <0.03)

## 2015-10-07 LAB — CBC
HCT: 41 % (ref 35.0–47.0)
Hemoglobin: 14 g/dL (ref 12.0–16.0)
MCH: 32.3 pg (ref 26.0–34.0)
MCHC: 34.2 g/dL (ref 32.0–36.0)
MCV: 94.6 fL (ref 80.0–100.0)
PLATELETS: 234 10*3/uL (ref 150–440)
RBC: 4.33 MIL/uL (ref 3.80–5.20)
RDW: 13.6 % (ref 11.5–14.5)
WBC: 8.1 10*3/uL (ref 3.6–11.0)

## 2015-10-07 LAB — HEPATIC FUNCTION PANEL
ALK PHOS: 54 U/L (ref 38–126)
ALT: 17 U/L (ref 14–54)
AST: 21 U/L (ref 15–41)
Albumin: 3.6 g/dL (ref 3.5–5.0)
BILIRUBIN TOTAL: 0.3 mg/dL (ref 0.3–1.2)
Total Protein: 6.2 g/dL — ABNORMAL LOW (ref 6.5–8.1)

## 2015-10-07 MED ORDER — SODIUM CHLORIDE 0.9 % IV BOLUS (SEPSIS)
500.0000 mL | Freq: Once | INTRAVENOUS | Status: AC
  Administered 2015-10-07: 500 mL via INTRAVENOUS

## 2015-10-07 NOTE — ED Provider Notes (Addendum)
[facility] Emergency Department Provider Note   ____________________________________________   First MD Initiated Contact with Patient 10/07/15 1504     (approximate)  I have reviewed the triage vital signs and the nursing notes.   HISTORY  Chief Complaint Weakness    HPI Darlene Paul is a 82 y.o. female history of hypertension, hyperlipidemia, GERD, hypothyroidism, dementiapresents for evaluation of generalized weakness since yesterday, gradual onset, constant, moderate, no modifying factors. The patient lives independently at home. Family went to get her for church today and she reported that she felt weak and didn't want to go to church. Not noticed any unilateral weakness or speech difficulty or change in her mental status. They convinced her to eat and drink but it didn't seem to help her demeanor. She reports she has had a runny nose but she denies any other recent illness. She denies chest pain, difficulty breathing, abdominal pain, no vomiting or diarrhea, no fevers or chills. Family reports that over the past month she has lost 10 pounds and they are not sure whether not she is eating/drinking well at home. She denies any recent falls.   Past Medical History:  Diagnosis Date  . Alopecia of scalp   . Arthritis   . Cancer ([place]) 1992   right breast  . Complication of anesthesia    rash on left arm after surgery  . GERD (gastroesophageal reflux disease)   . H/O hiatal hernia   . Hard of hearing   . Hypercholesterolemia   . Hypertension   . Hypothyroidism    "nodules x 3"- s/p Radioactive Iodine Therapy  . Macular degeneration of both eyes    injection  right eye 05/31/12    There are no active problems to display for this patient.   Past Surgical History:  Procedure Laterality Date  . APPENDECTOMY    . BREAST SURGERY     right mastectomy with node dissection  . CYSTOGRAM N/A 06/14/2012   Procedure: CYSTOGRAM;  Surgeon: Molli Hazard, MD;  Location: [facility];  Service: Urology;  Laterality: N/A;  . CYSTOSCOPY W/ RETROGRADES N/A 06/14/2012   Procedure: CYSTOSCOPY WITH RETROGRADE PYELOGRAM;  Surgeon: Molli Hazard, MD;  Location: [facility];  Service: Urology;  Laterality: N/A;  . CYSTOSCOPY WITH BIOPSY N/A 06/14/2012   Procedure: CYSTOSCOPY WITH BIOPSY;  Surgeon: Molli Hazard, MD;  Location: [facility];  Service: Urology;  Laterality: N/A;  . EYE SURGERY Bilateral    cataract extraction with IOL  . JOINT REPLACEMENT Left    knee  . OVARIAN CYST REMOVAL      Prior to Admission medications   Medication Sig Start Date End Date Taking? Authorizing Provider  amLODipine (NORVASC) 5 MG tablet Take 5 mg by mouth daily.    Historical Provider, MD  aspirin 81 MG tablet Take 81 mg by mouth daily.    Historical Provider, MD  Calcium Carbonate-Vitamin D (CALCIUM-VITAMIN D) 500-200 MG-UNIT per tablet Take 1 tablet by mouth 2 (two) times daily with a meal.    Historical Provider, MD  cephALEXin (KEFLEX) 500 MG capsule Take 1 capsule (500 mg total) by mouth 3 (three) times daily. 06/14/12   Rolan Bucco, MD  HYDROcodone-acetaminophen (NORCO/VICODIN) 5-325 MG per tablet Take 1-2 tablets by mouth every 4 (four) hours as needed for pain. 06/14/12   Rolan Bucco, MD  hyoscyamine (LEVSIN, ANASPAZ) 0.125 MG tablet Take 1 tablet (0.125 mg total) by mouth every 4 (four) hours as needed for cramping (  bladder spasms). 06/14/12   Rolan Bucco, MD  levothyroxine (SYNTHROID, LEVOTHROID) 50 MCG tablet Take 50 mcg by mouth daily.    Historical Provider, MD  Methenamine-Sodium Salicylate (CYSTEX) XX123456 MG TABS Take 2 tablets by mouth 2 (two) times daily as needed.     Historical Provider, MD  Multiple Vitamin (MULTIVITAMIN) tablet Take 1 tablet by mouth daily.    Historical Provider, MD  Multiple Vitamins-Minerals (OCUVITE PRESERVISION PO) Take 1 tablet by mouth every morning.    Historical Provider, MD  omeprazole (PRILOSEC) 20  MG capsule Take 20 mg by mouth daily.    Historical Provider, MD  oxybutynin (DITROPAN) 5 MG tablet Take 1 tablet (5 mg total) by mouth every 6 (six) hours as needed. 06/14/12   Rolan Bucco, MD  phenazopyridine (PYRIDIUM) 95 MG tablet Take 95 mg by mouth 3 (three) times daily as needed for pain.    Historical Provider, MD  pravastatin (PRAVACHOL) 80 MG tablet Take 80 mg by mouth daily.    Historical Provider, MD  senna-docusate (SENOKOT S) 8.6-50 MG per tablet Take 1 tablet by mouth 2 (two) times daily. 06/14/12   Rolan Bucco, MD    Allergies Azithromycin and Sulfa antibiotics  History reviewed. No pertinent family history.  Social History Social History  Substance Use Topics  . Smoking status: Never Smoker  . Smokeless tobacco: Never Used  . Alcohol use No    Review of Systems Constitutional: No fever/chills Eyes: No visual changes. ENT: No sore throat. Cardiovascular: Denies chest pain. Respiratory: Denies shortness of breath. Gastrointestinal: No abdominal pain.  No nausea, no vomiting.  No diarrhea.  No constipation. Genitourinary: Negative for dysuria. Musculoskeletal: Negative for back pain. Skin: Negative for rash. Neurological: Negative for headaches, focal weakness or numbness.  10-point ROS otherwise negative.  ____________________________________________   PHYSICAL EXAM:  Vitals:   10/07/15 1356 10/07/15 1500 10/07/15 1530 10/07/15 1702  BP: 127/71 129/66 118/63 (!) 152/95  Pulse: 79 74 81 87  Resp: 18 (!) 23  13  Temp: 98.1 F (36.7 C)     SpO2: 99% 97% 97% 98%  Weight: 120 lb (54.4 kg)     Height: 5\' 4"  (1.626 m)       VITAL SIGNS: ED Triage Vitals  Enc Vitals Group     BP 10/07/15 1356 127/71     Pulse Rate 10/07/15 1356 79     Resp 10/07/15 1356 18     Temp 10/07/15 1356 98.1 F (36.7 C)     Temp src --      SpO2 10/07/15 1356 99 %     Weight 10/07/15 1356 120 lb (54.4 kg)     Height 10/07/15 1356 5\' 4"  (1.626 m)     Head  Circumference --      Peak Flow --      Pain Score 10/07/15 1357 0     Pain Loc --      Pain Edu? --      Excl. in [place]? --     Constitutional: Alert and oriented. Well-appearing and in no acute distress. Eyes: Conjunctivae are normal. PERRL. EOMI. Head: Atraumatic. Nose: Mild congestion/rhinnorhea. Mouth/Throat: Mucous membranes are moist.  Oropharynx non-erythematous. Neck: No stridor.  Supple without meningismus. Cardiovascular: Normal rate, regular rhythm. Grossly normal heart sounds.  Good peripheral circulation. Respiratory: Normal respiratory effort.  No retractions. Lungs CTAB. Gastrointestinal: Soft and nontender. No distention.  No CVA tenderness. Genitourinary: deferred Musculoskeletal: No lower extremity tenderness nor edema. No calf swelling, tenderness  or asymmetry.  No joint effusions. Neurologic:  Normal speech and language. No gross focal neurologic deficits are appreciated. 5 out of 5 strength in bilateral upper and lower extremities, sensation intact to light touch throughout, cranial nerves II through XII intact, normal finger-nose-finger. Normal ambulation. Skin:  Skin is warm, dry and intact. No rash noted. Psychiatric: Mood and affect are normal. Speech and behavior are normal.  ____________________________________________   LABS (all labs ordered are listed, but only abnormal results are displayed)  Labs Reviewed  BASIC METABOLIC PANEL - Abnormal; Notable for the following:       Result Value   Glucose, Bld 124 (*)    BUN 21 (*)    GFR calc non Af Amer 55 (*)    All other components within normal limits  URINALYSIS COMPLETEWITH MICROSCOPIC ([facility] ONLY) - Abnormal; Notable for the following:    Color, Urine YELLOW (*)    APPearance HAZY (*)    Protein, ur 30 (*)    All other components within normal limits  HEPATIC FUNCTION PANEL - Abnormal; Notable for the following:    Total Protein 6.2 (*)    Bilirubin, Direct <0.1 (*)    All other components within  normal limits  URINE CULTURE  CBC  TROPONIN I  CBG MONITORING, ED   ____________________________________________  EKG  ED ECG REPORT I, Joanne Gavel, the attending physician, personally viewed and interpreted this ECG.   Date: 10/07/2015  EKG Time: 14:08  Rate: 83  Rhythm: normal sinus rhythm with sinus arrhythmia  Axis: left  Intervals:none  ST&T Change: No acute ST elevation or acute ST depression. Moderate voltage criteria for LVH.  ____________________________________________  RADIOLOGY  CXR IMPRESSION: No acute cardiopulmonary abnormality. Chronic hiatal hernia.  CT head IMPRESSION: 1. No acute intracranial findings. 2. Periventricular white matter and corona radiata hypodensities favor chronic ischemic microvascular white matter disease. 3. Chronic dense calcification in the left lentiform nucleus, likely dystrophic or the result of a remote insult in the region. This has been present on prior exams at least back through 1997. 4. Abnormal hypodense lesion of the right frontal calvarium,, with some bony expansion and trabecular accentuation in the diploic space, mild cortical demineralization, and partial removal of the bone with a mesh plate in place. Overall the distribution is not changed from the prior brain MRI from 2012. Accordingly I suspect that this represents a benign process such as fibrous dysplasia or Paget's disease. It was also discussed on the prior head CT from 1997. Correlate with operative history and pathology results. ____________________________________________   PROCEDURES  Procedure(s) performed: None  Procedures  Critical Care performed: No  ____________________________________________   INITIAL IMPRESSION / ASSESSMENT AND PLAN / ED COURSE  Pertinent labs & imaging results that were available during my care of the patient were reviewed by me and considered in my medical decision making (see chart for details).  ALIZEAH QUINTILIANI is a 82 y.o. female history of hypertension, hyperlipidemia, GERD, hypothyroidism, dementiapresents for evaluation of generalized weakness since yesterday. On exam, she is generally well-appearing and in no acute distress. Vital signs stable, she is afebrile. She has a benign physical exam, intact neurological examination, no evidence to suggest CVA acutely. We'll obtain screening labs, chest x-ray, CT head, urinalysis to evaluate for acute reversible cause of her generalized weakness such as infectious process or acute metabolic derangement, cardiac ischemia. Reassess for disposition. Her EKG is reassuring, not consistent with ischemia.  ----------------------------------------- 5:10 PM on 10/07/2015 ----------------------------------------- Patient  reports she feels much better at this time after IV fluids. I reviewed her labs, CBC unremarkable, unremarkable BMP, normal liver function panel the exception of mild decrease in her total protein which is nonspecific, urinalysis not consistent with infection. Negative troponin. CT head shows no acute intra-cranial process. Chest x-ray shows no acute cardiopulmonary disease. Patient is ambulatory without anyassistance, she reports that she would like to go home and her family is comfortable with this. I discussed with her that she will need to follow up with her primary care doctor tomorrow, we discussed meticulous immediate return precautions and all are comfortable with the discharge plan. DC home.   Clinical Course     ____________________________________________   FINAL CLINICAL IMPRESSION(S) / ED DIAGNOSES  Final diagnoses:  Generalized weakness      NEW MEDICATIONS STARTED DURING THIS VISIT:  New Prescriptions   No medications on file     Note:  This document was prepared using Dragon voice recognition software and may include unintentional dictation errors.    Joanne Gavel, MD 10/07/15 [place] Prill,  MD 10/07/15 [place], MD 10/07/15 [id number]

## 2015-10-07 NOTE — ED Triage Notes (Addendum)
Family in triage states they came to pick up pt for church and states pt wasn't ready and has been complaining of being weak.  Family reports weight loss of about 10 lbs in the past month.  Pt states she is eating and family states she ate a biscuit and drank 2 boosts and drank a cup coffee. Pt is hard of hearing and lives alone with family frequently checking on her.

## 2015-10-07 NOTE — ED Notes (Signed)
Patient able to ambulate without assistance to the nurses station and back

## 2015-10-09 LAB — URINE CULTURE: CULTURE: NO GROWTH

## 2015-10-31 ENCOUNTER — Emergency Department
Admission: EM | Admit: 2015-10-31 | Discharge: 2015-10-31 | Disposition: A | Discharge status: Other Acute Inpatient Hospital | Payer: Medicare Other | Attending: Student in an Organized Health Care Education/Training Program | Admitting: Student in an Organized Health Care Education/Training Program

## 2015-10-31 ENCOUNTER — Observation Stay (HOSPITAL_COMMUNITY)
Admission: AD | Admit: 2015-10-31 | Discharge: 2015-11-03 | Disposition: A | Discharge status: Home / Self Care | Payer: Medicare Other | Source: Other Acute Inpatient Hospital | Attending: Family Medicine | Admitting: Family Medicine

## 2015-10-31 DIAGNOSIS — K922 Gastrointestinal hemorrhage, unspecified: Secondary | ICD-10-CM | POA: Diagnosis not present

## 2015-10-31 DIAGNOSIS — K921 Melena: Secondary | ICD-10-CM | POA: Diagnosis present

## 2015-10-31 DIAGNOSIS — E039 Hypothyroidism, unspecified: Secondary | ICD-10-CM | POA: Diagnosis not present

## 2015-10-31 DIAGNOSIS — G309 Alzheimer's disease, unspecified: Secondary | ICD-10-CM | POA: Insufficient documentation

## 2015-10-31 DIAGNOSIS — Z7982 Long term (current) use of aspirin: Secondary | ICD-10-CM | POA: Insufficient documentation

## 2015-10-31 DIAGNOSIS — F0281 Dementia in other diseases classified elsewhere with behavioral disturbance: Secondary | ICD-10-CM

## 2015-10-31 DIAGNOSIS — F02818 Dementia in other diseases classified elsewhere, unspecified severity, with other behavioral disturbance: Secondary | ICD-10-CM

## 2015-10-31 DIAGNOSIS — I10 Essential (primary) hypertension: Secondary | ICD-10-CM | POA: Insufficient documentation

## 2015-10-31 DIAGNOSIS — G301 Alzheimer's disease with late onset: Secondary | ICD-10-CM

## 2015-10-31 DIAGNOSIS — K625 Hemorrhage of anus and rectum: Secondary | ICD-10-CM | POA: Diagnosis not present

## 2015-10-31 DIAGNOSIS — Z96652 Presence of left artificial knee joint: Secondary | ICD-10-CM | POA: Insufficient documentation

## 2015-10-31 DIAGNOSIS — Z853 Personal history of malignant neoplasm of breast: Secondary | ICD-10-CM | POA: Insufficient documentation

## 2015-10-31 DIAGNOSIS — F0280 Dementia in other diseases classified elsewhere without behavioral disturbance: Secondary | ICD-10-CM | POA: Diagnosis present

## 2015-10-31 DIAGNOSIS — Z79899 Other long term (current) drug therapy: Secondary | ICD-10-CM | POA: Diagnosis not present

## 2015-10-31 HISTORY — DX: Malignant melanoma of other parts of face: C43.39

## 2015-10-31 LAB — COMPREHENSIVE METABOLIC PANEL
ALT: 17 U/L (ref 14–54)
AST: 19 U/L (ref 15–41)
Albumin: 3.7 g/dL (ref 3.5–5.0)
Alkaline Phosphatase: 66 U/L (ref 38–126)
Anion gap: 5 (ref 5–15)
BILIRUBIN TOTAL: 0.5 mg/dL (ref 0.3–1.2)
BUN: 24 mg/dL — AB (ref 6–20)
CO2: 26 mmol/L (ref 22–32)
Calcium: 9.4 mg/dL (ref 8.9–10.3)
Chloride: 106 mmol/L (ref 101–111)
Creatinine, Ser: 0.85 mg/dL (ref 0.44–1.00)
GFR calc Af Amer: >60 mL/min (ref >60)
GFR, EST NON AFRICAN AMERICAN: 58 mL/min — AB (ref >60)
Glucose, Bld: 129 mg/dL — ABNORMAL HIGH (ref 65–99)
POTASSIUM: 4.1 mmol/L (ref 3.5–5.1)
Sodium: 137 mmol/L (ref 135–145)
TOTAL PROTEIN: 6.6 g/dL (ref 6.5–8.1)

## 2015-10-31 LAB — CBC
HEMATOCRIT: 41.1 % (ref 35.0–47.0)
Hemoglobin: 13.9 g/dL (ref 12.0–16.0)
MCH: 31.9 pg (ref 26.0–34.0)
MCHC: 33.7 g/dL (ref 32.0–36.0)
MCV: 94.7 fL (ref 80.0–100.0)
PLATELETS: 264 10*3/uL (ref 150–440)
RBC: 4.34 MIL/uL (ref 3.80–5.20)
RDW: 14.1 % (ref 11.5–14.5)
WBC: 7.5 10*3/uL (ref 3.6–11.0)

## 2015-10-31 LAB — TYPE AND SCREEN
ABO/RH(D): AB POS
ANTIBODY SCREEN: NEGATIVE

## 2015-10-31 LAB — PROTIME-INR
INR: 0.94
Prothrombin Time: 12.6 seconds (ref 11.4–15.2)

## 2015-10-31 MED ORDER — PANTOPRAZOLE SODIUM 40 MG IV SOLR
60.0000 mg | Freq: Once | INTRAVENOUS | Status: AC
  Administered 2015-10-31: 60 mg via INTRAVENOUS
  Filled 2015-10-31: qty 60

## 2015-10-31 MED ORDER — PANTOPRAZOLE SODIUM 40 MG IV SOLR: 40.0000 mg | Freq: Two times a day (BID) | INTRAVENOUS | Status: DC

## 2015-10-31 MED ORDER — SODIUM CHLORIDE 0.9 % IV SOLN: 8.0000 mg/h | INTRAVENOUS | Status: DC

## 2015-10-31 NOTE — ED Notes (Addendum)
Nurse and MD went into access patient and patient had more bright red blood from rectum. Patient was cleaned and changed.

## 2015-10-31 NOTE — Progress Notes (Signed)
This is a no charge note  Transfer from [facility] per Dr. Quentin Cornwall  82 year old lady with a past medical history of diverticulosis, hypertension, hyperlipidemia, GERD, hypothyroidism, right breast cancer 1992, macular degeneration, who presents with 4 episode of rectal bleeding this evening.  Patient was found to have hemoglobin 13.9 which was 14.10/07/15, electrolytes and renal function okay, blood pressure 124/72, heart rate 100. protonix 60 mg was given. Due to lack of GI coverage, pt is transferred to [facility]. Pt is accepted to tele bed for obs. Since pt's stable and Hgb is 13.9, I did not ask EDP to call GI tonight. Please call GI in AM.    Ivor Costa, MD  [facility] Pager [phone]  If 7PM-7AM, please contact night-coverage [email or web address] Password [email or web address] 10/31/2015, 8:22 PM

## 2015-10-31 NOTE — ED Notes (Addendum)
Nurse assessed patients bottom and bright red clots of blood were accessed from rectum and patient was cleaned up and changed.

## 2015-10-31 NOTE — ED Notes (Signed)
This IV not in place when patient arrived in ED

## 2015-10-31 NOTE — ED Triage Notes (Signed)
Pt reports rectal bleeding this evening.

## 2015-10-31 NOTE — ED Provider Notes (Signed)
[facility] Emergency Department Provider Note    First MD Initiated Contact with Patient 10/31/15 1833     (approximate)  I have reviewed the triage vital signs and the nursing notes.   HISTORY  Chief Complaint Rectal Bleeding    HPI Darlene Paul is a 82 y.o. female with history of dementia and diverticulosis presents with 1 day of multiple episodes of loose bloody stools. No report of any abdominal pain. She does not take any blood thinners. She's never had a history of GI bleeds. Does have her port history of reflux but no known cirrhosis or liver disease. Has not been having any nausea or vomiting.She denies discomfort or complaints at this time.   Past Medical History:  Diagnosis Date  . Alopecia of scalp   . Arthritis   . Cancer ([place]) 1992   right breast  . Complication of anesthesia    rash on left arm after surgery  . GERD (gastroesophageal reflux disease)   . H/O hiatal hernia   . Hard of hearing   . Hypercholesterolemia   . Hypertension   . Hypothyroidism    "nodules x 3"- s/p Radioactive Iodine Therapy  . Macular degeneration of both eyes    injection  right eye 05/31/12    There are no active problems to display for this patient.   Past Surgical History:  Procedure Laterality Date  . APPENDECTOMY    . BREAST SURGERY     right mastectomy with node dissection  . CYSTOGRAM N/A 06/14/2012   Procedure: CYSTOGRAM;  Surgeon: Molli Hazard, MD;  Location: [facility];  Service: Urology;  Laterality: N/A;  . CYSTOSCOPY W/ RETROGRADES N/A 06/14/2012   Procedure: CYSTOSCOPY WITH RETROGRADE PYELOGRAM;  Surgeon: Molli Hazard, MD;  Location: [facility];  Service: Urology;  Laterality: N/A;  . CYSTOSCOPY WITH BIOPSY N/A 06/14/2012   Procedure: CYSTOSCOPY WITH BIOPSY;  Surgeon: Molli Hazard, MD;  Location: [facility];  Service: Urology;  Laterality: N/A;  . EYE SURGERY Bilateral    cataract extraction with IOL  . JOINT  REPLACEMENT Left    knee  . OVARIAN CYST REMOVAL      Prior to Admission medications   Medication Sig Start Date End Date Taking? Authorizing Provider  amLODipine (NORVASC) 5 MG tablet Take 5 mg by mouth daily.    Historical Provider, MD  aspirin 81 MG tablet Take 81 mg by mouth daily.    Historical Provider, MD  Calcium Carbonate-Vitamin D (CALCIUM-VITAMIN D) 500-200 MG-UNIT per tablet Take 1 tablet by mouth 2 (two) times daily with a meal.    Historical Provider, MD  cephALEXin (KEFLEX) 500 MG capsule Take 1 capsule (500 mg total) by mouth 3 (three) times daily. 06/14/12   Rolan Bucco, MD  HYDROcodone-acetaminophen (NORCO/VICODIN) 5-325 MG per tablet Take 1-2 tablets by mouth every 4 (four) hours as needed for pain. 06/14/12   Rolan Bucco, MD  hyoscyamine (LEVSIN, ANASPAZ) 0.125 MG tablet Take 1 tablet (0.125 mg total) by mouth every 4 (four) hours as needed for cramping (bladder spasms). 06/14/12   Rolan Bucco, MD  levothyroxine (SYNTHROID, LEVOTHROID) 50 MCG tablet Take 50 mcg by mouth daily.    Historical Provider, MD  Methenamine-Sodium Salicylate (CYSTEX) [id number] MG TABS Take 2 tablets by mouth 2 (two) times daily as needed.     Historical Provider, MD  Multiple Vitamin (MULTIVITAMIN) tablet Take 1 tablet by mouth daily.    Historical Provider, MD  Multiple Vitamins-Minerals (OCUVITE  PRESERVISION PO) Take 1 tablet by mouth every morning.    Historical Provider, MD  omeprazole (PRILOSEC) 20 MG capsule Take 20 mg by mouth daily.    Historical Provider, MD  oxybutynin (DITROPAN) 5 MG tablet Take 1 tablet (5 mg total) by mouth every 6 (six) hours as needed. 06/14/12   Rolan Bucco, MD  phenazopyridine (PYRIDIUM) 95 MG tablet Take 95 mg by mouth 3 (three) times daily as needed for pain.    Historical Provider, MD  pravastatin (PRAVACHOL) 80 MG tablet Take 80 mg by mouth daily.    Historical Provider, MD  senna-docusate (SENOKOT S) 8.6-50 MG per tablet Take 1 tablet by mouth 2  (two) times daily. 06/14/12   Rolan Bucco, MD    Allergies Azithromycin and Sulfa antibiotics  [place]: no known bleeding disorders  Social History Social History  Substance Use Topics  . Smoking status: Never Smoker  . Smokeless tobacco: Never Used  . Alcohol use No    Review of Systems Patient denies headaches, rhinorrhea, blurry vision, numbness, shortness of breath, chest pain, edema, cough, abdominal pain, nausea, vomiting, diarrhea, dysuria, fevers, rashes or hallucinations unless otherwise stated above in HPI. ____________________________________________   PHYSICAL EXAM:  VITAL SIGNS: Vitals:   10/31/15 2040 10/31/15 2220  BP: 140/85 129/64  Pulse: 71 78  Resp: 17 16  Temp:      Constitutional: Alert and oriented. Well appearing and in no acute distress. Eyes: Conjunctivae are normal. PERRL. EOMI. Head: Atraumatic. Nose: No congestion/rhinnorhea. Mouth/Throat: Mucous membranes are moist.  Oropharynx non-erythematous. Neck: No stridor. Painless ROM. No cervical spine tenderness to palpation Hematological/Lymphatic/Immunilogical: No cervical lymphadenopathy. Cardiovascular: Normal rate, regular rhythm. Grossly normal heart sounds.  Good peripheral circulation. Respiratory: Normal respiratory effort.  No retractions. Lungs CTAB. Gastrointestinal: Soft and nontender. No distention. No abdominal bruits. No CVA tenderness. Genitourinary: No evidence of hemorrhoid or rectal mass. Patient with large volume maroon colored blood colored liquid stool. Musculoskeletal: No lower extremity tenderness nor edema.  No joint effusions. Neurologic:  Normal speech and language. No gross focal neurologic deficits are appreciated. No gait instability. Skin:  Skin is warm, dry and intact. No rash noted. Psychiatric: Mood and affect are normal. Speech and behavior are normal.  ____________________________________________   LABS (all labs ordered are listed, but only abnormal results  are displayed)  Results for orders placed or performed during the hospital encounter of 10/31/15 (from the past 24 hour(s))  Comprehensive metabolic panel     Status: Abnormal   Collection Time: 10/31/15  6:17 PM  Result Value Ref Range   Sodium 137 135 - 145 mmol/L   Potassium 4.1 3.5 - 5.1 mmol/L   Chloride 106 101 - 111 mmol/L   CO2 26 22 - 32 mmol/L   Glucose, Bld 129 (H) 65 - 99 mg/dL   BUN 24 (H) 6 - 20 mg/dL   Creatinine, Ser 0.85 0.44 - 1.00 mg/dL   Calcium 9.4 8.9 - 10.3 mg/dL   Total Protein 6.6 6.5 - 8.1 g/dL   Albumin 3.7 3.5 - 5.0 g/dL   AST 19 15 - 41 U/L   ALT 17 14 - 54 U/L   Alkaline Phosphatase 66 38 - 126 U/L   Total Bilirubin 0.5 0.3 - 1.2 mg/dL   GFR calc non Af Amer 58 (L) >60 mL/min   GFR calc Af Amer >60 >60 mL/min   Anion gap 5 5 - 15  CBC     Status: None   Collection Time: 10/31/15  6:17 PM  Result Value Ref Range   WBC 7.5 3.6 - 11.0 K/uL   RBC 4.34 3.80 - 5.20 MIL/uL   Hemoglobin 13.9 12.0 - 16.0 g/dL   HCT 41.1 35.0 - 47.0 %   MCV 94.7 80.0 - 100.0 fL   MCH 31.9 26.0 - 34.0 pg   MCHC 33.7 32.0 - 36.0 g/dL   RDW 14.1 11.5 - 14.5 %   Platelets 264 150 - 440 K/uL  Type and screen [facility]     Status: None   Collection Time: 10/31/15  6:17 PM  Result Value Ref Range   ABO/RH(D) AB POS    Antibody Screen NEG    Sample Expiration 11/03/2015   Protime-INR     Status: None   Collection Time: 10/31/15  6:17 PM  Result Value Ref Range   Prothrombin Time 12.6 11.4 - 15.2 seconds   INR 0.94    ____________________________________________ _______________________________________   PROCEDURES  Procedure(s) performed: none    Critical Care performed: no ____________________________________________   INITIAL IMPRESSION / ASSESSMENT AND PLAN / ED COURSE  Pertinent labs & imaging results that were available during my care of the patient were reviewed by me and considered in my medical decision making (see chart for  details).  DDX: Hemorrhoids, diverticulosis, upper GI bleed, gastritis, mesenteric ischemia  Dama Boggan Charo is a 82 y.o. who presents to the ED with reported episodes of hematochezia my read blood per rectum with known history of diverticular disease. Patient is not on any anticoagulation. Rectal exam does not show any evidence of hemorrhoids. Does have large volume maroon and blood colored liquid stool. Patient remains hemodynamically stable. Abdominal exam is soft and benign. She has no epigastric pain or history of liver disease to suggest esophageal varices or peptic ulcer bleed.  The patient will be placed on continuous pulse oximetry and telemetry for monitoring.  Laboratory evaluation will be sent to evaluate for the above complaints.     Clinical Course  Comment By Time  Patient with roughly 50 cc of dark maroon colored stool. No hemorrhoids. No rectal mass. Abdominal exam is soft. I do suspect this is from probable diverticular bleed. As we do not have GI coverage today or tomorrow patient will require transfer. Merlyn Lot, MD 09/06 1949  Spoke with Dr. Blaine Hamper who agrees to accept patient for further monitoring at [facility]. Merlyn Lot, MD 09/06 2034     ____________________________________________   FINAL CLINICAL IMPRESSION(S) / ED DIAGNOSES  Final diagnoses:  Acute GI bleeding      NEW MEDICATIONS STARTED DURING THIS VISIT:  Discharge Medication List as of 10/31/2015 11:02 PM       Note:  This document was prepared using Dragon voice recognition software and may include unintentional dictation errors.    Merlyn Lot, MD 11/01/15 0000

## 2015-11-01 ENCOUNTER — Encounter (HOSPITAL_COMMUNITY): Payer: Self-pay | Admitting: Internal Medicine

## 2015-11-01 DIAGNOSIS — F0280 Dementia in other diseases classified elsewhere without behavioral disturbance: Secondary | ICD-10-CM | POA: Diagnosis present

## 2015-11-01 DIAGNOSIS — G301 Alzheimer's disease with late onset: Secondary | ICD-10-CM | POA: Diagnosis not present

## 2015-11-01 DIAGNOSIS — G309 Alzheimer's disease, unspecified: Secondary | ICD-10-CM

## 2015-11-01 DIAGNOSIS — F0281 Dementia in other diseases classified elsewhere with behavioral disturbance: Secondary | ICD-10-CM | POA: Diagnosis not present

## 2015-11-01 DIAGNOSIS — K625 Hemorrhage of anus and rectum: Secondary | ICD-10-CM | POA: Diagnosis present

## 2015-11-01 LAB — CBC
HCT: 35.1 % — ABNORMAL LOW (ref 36.0–46.0)
HEMATOCRIT: 35 % — AB (ref 36.0–46.0)
HEMOGLOBIN: 11.4 g/dL — AB (ref 12.0–15.0)
Hemoglobin: 11.3 g/dL — ABNORMAL LOW (ref 12.0–15.0)
MCH: 31 pg (ref 26.0–34.0)
MCH: 31.1 pg (ref 26.0–34.0)
MCHC: 32.5 g/dL (ref 30.0–36.0)
MCHC: 32.3 g/dL (ref 30.0–36.0)
MCV: 95.4 fL (ref 78.0–100.0)
MCV: 96.4 fL (ref 78.0–100.0)
PLATELETS: 234 10*3/uL (ref 150–400)
PLATELETS: 257 10*3/uL (ref 150–400)
RBC: 3.68 MIL/uL — AB (ref 3.87–5.11)
RBC: 3.63 MIL/uL — ABNORMAL LOW (ref 3.87–5.11)
RDW: 13.8 % (ref 11.5–15.5)
RDW: 13.8 % (ref 11.5–15.5)
WBC: 6 10*3/uL (ref 4.0–10.5)
WBC: 6.9 10*3/uL (ref 4.0–10.5)

## 2015-11-01 LAB — TYPE AND SCREEN
ABO/RH(D): AB POS
ANTIBODY SCREEN: NEGATIVE

## 2015-11-01 LAB — ABO/RH: ABO/RH(D): AB POS

## 2015-11-01 MED ORDER — CALCIUM CARBONATE-VITAMIN D 500-200 MG-UNIT PO TABS
1.0000 | ORAL_TABLET | Freq: Two times a day (BID) | ORAL | Status: DC
  Administered 2015-11-01 – 2015-11-03 (×4): 1 via ORAL
  Filled 2015-11-01 (×4): qty 1

## 2015-11-01 MED ORDER — RISPERIDONE 1 MG PO TABS
1.0000 mg | ORAL_TABLET | Freq: Every day | ORAL | Status: DC
  Administered 2015-11-01 – 2015-11-02 (×3): 1 mg via ORAL
  Filled 2015-11-01 (×3): qty 1

## 2015-11-01 MED ORDER — PRAVASTATIN SODIUM 40 MG PO TABS
80.0000 mg | ORAL_TABLET | Freq: Every day | ORAL | Status: DC
  Administered 2015-11-01 – 2015-11-03 (×4): 80 mg via ORAL
  Filled 2015-11-01 (×3): qty 2
  Filled 2015-11-01: qty 4

## 2015-11-01 MED ORDER — SODIUM CHLORIDE 0.9% FLUSH
3.0000 mL | Freq: Two times a day (BID) | INTRAVENOUS | Status: DC
  Administered 2015-11-01 – 2015-11-03 (×5): 3 mL via INTRAVENOUS

## 2015-11-01 MED ORDER — DONEPEZIL HCL 10 MG PO TABS
10.0000 mg | ORAL_TABLET | Freq: Every day | ORAL | Status: DC
  Administered 2015-11-01 – 2015-11-02 (×3): 10 mg via ORAL
  Filled 2015-11-01 (×3): qty 1

## 2015-11-01 MED ORDER — PANTOPRAZOLE SODIUM 40 MG PO TBEC
40.0000 mg | DELAYED_RELEASE_TABLET | Freq: Every day | ORAL | Status: DC
  Administered 2015-11-01 – 2015-11-03 (×3): 40 mg via ORAL
  Filled 2015-11-01 (×3): qty 1

## 2015-11-01 NOTE — Consult Note (Signed)
[facility] Consult Note  Referring Provider: No ref. provider found Primary Care Physician:  Darlene Crouch, MD Primary Gastroenterologist:  Dr.  Laurel Paul Complaint: Rectal bleeding HPI: Darlene Paul is an 82 y.o. white female  with a history of diverticulosis and colon polyps by colonoscopy in 2009 he presents with painless rectal bleeding once at home yesterday and at least one since admission. Her hemoglobin was 13.9 with stable vital signs. The patient gives limited history due to her Alzheimer's dementia.  Past Medical History:  Diagnosis Date  . Alopecia of scalp   . Arthritis   . Cancer ([place]) 1992   right breast  . Complication of anesthesia    rash on left arm after surgery  . GERD (gastroesophageal reflux disease)   . H/O hiatal hernia   . Hard of hearing   . Hypercholesterolemia   . Hypertension   . Hypothyroidism    "nodules x 3"- s/p Radioactive Iodine Therapy  . Macular degeneration of both eyes    injection  right eye 05/31/12  . Melanoma of forehead ([place])    Removed previously    Past Surgical History:  Procedure Laterality Date  . APPENDECTOMY    . BREAST SURGERY     right mastectomy with node dissection  . CYSTOGRAM N/A 06/14/2012   Procedure: CYSTOGRAM;  Surgeon: Darlene Hazard, MD;  Location: [facility];  Service: Urology;  Laterality: N/A;  . CYSTOSCOPY W/ RETROGRADES N/A 06/14/2012   Procedure: CYSTOSCOPY WITH RETROGRADE PYELOGRAM;  Surgeon: Darlene Hazard, MD;  Location: [facility];  Service: Urology;  Laterality: N/A;  . CYSTOSCOPY WITH BIOPSY N/A 06/14/2012   Procedure: CYSTOSCOPY WITH BIOPSY;  Surgeon: Darlene Hazard, MD;  Location: [facility];  Service: Urology;  Laterality: N/A;  . EYE SURGERY Bilateral    cataract extraction with IOL  . JOINT REPLACEMENT Left    knee  . OVARIAN CYST REMOVAL      Medications Prior to Admission  Medication Sig Dispense Refill  . aspirin 81 MG tablet Take 81 mg by mouth daily.    .  Calcium Carbonate-Vitamin D (CALCIUM-VITAMIN D) 500-200 MG-UNIT per tablet Take 1 tablet by mouth 2 (two) times daily with a meal.    . hyoscyamine (LEVSIN, ANASPAZ) 0.125 MG tablet Take 1 tablet (0.125 mg total) by mouth every 4 (four) hours as needed for cramping (bladder spasms). 40 tablet 4  . Multiple Vitamin (MULTIVITAMIN) tablet Take 1 tablet by mouth daily.    . Multiple Vitamins-Minerals (OCUVITE PRESERVISION PO) Take 1 tablet by mouth every morning.    Marland Kitchen omeprazole (PRILOSEC) 20 MG capsule Take 20 mg by mouth daily.    . pravastatin (PRAVACHOL) 80 MG tablet Take 80 mg by mouth daily.    Marland Kitchen senna-docusate (SENOKOT S) 8.6-50 MG per tablet Take 1 tablet by mouth 2 (two) times daily. 60 tablet 0  . [DISCONTINUED] amLODipine (NORVASC) 5 MG tablet Take 5 mg by mouth daily.    . [DISCONTINUED] cephALEXin (KEFLEX) 500 MG capsule Take 1 capsule (500 mg total) by mouth 3 (three) times daily. 42 capsule 0  . [DISCONTINUED] HYDROcodone-acetaminophen (NORCO/VICODIN) 5-325 MG per tablet Take 1-2 tablets by mouth every 4 (four) hours as needed for pain. 25 tablet 0  . [DISCONTINUED] levothyroxine (SYNTHROID, LEVOTHROID) 50 MCG tablet Take 50 mcg by mouth daily.    . [DISCONTINUED] Methenamine-Sodium Salicylate (CYSTEX) XX123456 MG TABS Take 2 tablets by mouth 2 (two) times daily as needed.     . [DISCONTINUED] oxybutynin (  DITROPAN) 5 MG tablet Take 1 tablet (5 mg total) by mouth every 6 (six) hours as needed. 40 tablet 4  . [DISCONTINUED] phenazopyridine (PYRIDIUM) 95 MG tablet Take 95 mg by mouth 3 (three) times daily as needed for pain.      Allergies:  Allergies  Allergen Reactions  . Azithromycin Palpitations  . Sulfa Antibiotics Rash    No family history on file.  Social History:  reports that she has never smoked. She has never used smokeless tobacco. She reports that she does not drink alcohol or use drugs.  Review of Systems: negative except As above   Blood pressure 127/60, pulse  66, temperature 98.4 F (36.9 C), temperature source Oral, resp. rate 18, height 5\' 7"  (1.702 m), weight 58.8 kg (129 lb 10.1 oz), SpO2 97 %. Head: Normocephalic, without obvious abnormality, atraumatic Neck: no adenopathy, no carotid bruit, no JVD, supple, symmetrical, trachea midline and thyroid not enlarged, symmetric, no tenderness/mass/nodules Resp: clear to auscultation bilaterally Cardio: regular rate and rhythm, S1, S2 normal, no murmur, click, rub or gallop GI: Abdomen soft nondistended with normoactive bowel sounds. No hepatomegaly masses or guarding Extremities: extremities normal, atraumatic, no cyanosis or edema  Results for orders placed or performed during the hospital encounter of 10/31/15 (from the past 48 hour(s))  CBC     Status: Abnormal   Collection Time: 11/01/15  2:01 AM  Result Value Ref Range   WBC 6.0 4.0 - 10.5 K/uL   RBC 3.68 (L) 3.87 - 5.11 MIL/uL   Hemoglobin 11.4 (L) 12.0 - 15.0 g/dL   HCT 35.1 (L) 36.0 - 46.0 %   MCV 95.4 78.0 - 100.0 fL   MCH 31.0 26.0 - 34.0 pg   MCHC 32.5 30.0 - 36.0 g/dL   RDW 13.8 11.5 - 15.5 %   Platelets 234 150 - 400 K/uL  Type and screen [place]     Status: None   Collection Time: 11/01/15  2:02 AM  Result Value Ref Range   ABO/RH(D) AB POS    Antibody Screen NEG    Sample Expiration 11/04/2015   ABO/Rh     Status: None   Collection Time: 11/01/15  2:02 AM  Result Value Ref Range   ABO/RH(D) AB POS    No results found.  Assessment: Painless lower GI bleed, probably diverticular Plan:  Monitor stools and hemoglobin, Transfuse as necessary Consider sigmoidoscopy or pulled RBC scan if bleeding persists or patient becomes unstable. Darlene Paul C 11/01/2015, 9:20 AM  Pager [phone] If no answer or after 5 PM call [phone]

## 2015-11-01 NOTE — H&P (Signed)
History and Physical    Darlene Paul [id number] DOB: [birth year] DOA: 10/31/2015   PCP: Idelle Crouch, MD Chief Complaint: BRBPR  HPI: Darlene Paul is a 82 y.o. female with medical history significant of Alzheimer's dementia and diverticulosis.  No h/o GIB previously.  Patient presents to the ED with c/o 1 day history of multiple episodes of loose bloody stool with clots in it.  Symptoms are persistent.  No ABD pain.  On no blood thinners.  No N/V.  ED Course: Patient presented to [facility] ED.  HGB 13.9 from baseline of 14.1.  Patient transferred to [facility] due to no GI coverage at [facility].  Review of Systems: As per HPI otherwise 10 point review of systems negative.  Patient has no complaints right now but cant really participate much due to dementia.   Past Medical History:  Diagnosis Date  . Alopecia of scalp   . Arthritis   . Cancer ([place]) 1992   right breast  . Complication of anesthesia    rash on left arm after surgery  . GERD (gastroesophageal reflux disease)   . H/O hiatal hernia   . Hard of hearing   . Hypercholesterolemia   . Hypertension   . Hypothyroidism    "nodules x 3"- s/p Radioactive Iodine Therapy  . Macular degeneration of both eyes    injection  right eye 05/31/12    Past Surgical History:  Procedure Laterality Date  . APPENDECTOMY    . BREAST SURGERY     right mastectomy with node dissection  . CYSTOGRAM N/A 06/14/2012   Procedure: CYSTOGRAM;  Surgeon: Molli Hazard, MD;  [facility];  Service: Urology;  Laterality: N/A;  . CYSTOSCOPY W/ RETROGRADES N/A 06/14/2012   Procedure: CYSTOSCOPY WITH RETROGRADE PYELOGRAM;  Surgeon: Molli Hazard, MD;  [facility];  Service: Urology;  Laterality: N/A;  . CYSTOSCOPY WITH BIOPSY N/A 06/14/2012   Procedure: CYSTOSCOPY WITH BIOPSY;  Surgeon: Molli Hazard, MD;  [facility];  Service: Urology;  Laterality: N/A;  . EYE SURGERY Bilateral    cataract extraction with IOL  . JOINT  REPLACEMENT Left    knee  . OVARIAN CYST REMOVAL       reports that she has never smoked. She has never used smokeless tobacco. She reports that she does not drink alcohol or use drugs.  Allergies  Allergen Reactions  . Azithromycin Palpitations  . Sulfa Antibiotics Rash    No family history on file. Cant really complete due to patient dementia.   Prior to Admission medications   Medication Sig Start Date End Date Taking? Authorizing Provider  amLODipine (NORVASC) 5 MG tablet Take 5 mg by mouth daily.    Historical Provider, MD  aspirin 81 MG tablet Take 81 mg by mouth daily.    Historical Provider, MD  Calcium Carbonate-Vitamin D (CALCIUM-VITAMIN D) 500-200 MG-UNIT per tablet Take 1 tablet by mouth 2 (two) times daily with a meal.    Historical Provider, MD  cephALEXin (KEFLEX) 500 MG capsule Take 1 capsule (500 mg total) by mouth 3 (three) times daily. 06/14/12   Rolan Bucco, MD  HYDROcodone-acetaminophen (NORCO/VICODIN) 5-325 MG per tablet Take 1-2 tablets by mouth every 4 (four) hours as needed for pain. 06/14/12   Rolan Bucco, MD  hyoscyamine (LEVSIN, ANASPAZ) 0.125 MG tablet Take 1 tablet (0.125 mg total) by mouth every 4 (four) hours as needed for cramping (bladder spasms). 06/14/12   Rolan Bucco, MD  levothyroxine ([place], [place]) 45  MCG tablet Take 50 mcg by mouth daily.    Historical Provider, MD  Methenamine-Sodium Salicylate (CYSTEX) XX123456 MG TABS Take 2 tablets by mouth 2 (two) times daily as needed.     Historical Provider, MD  Multiple Vitamin (MULTIVITAMIN) tablet Take 1 tablet by mouth daily.    Historical Provider, MD  Multiple Vitamins-Minerals (OCUVITE PRESERVISION PO) Take 1 tablet by mouth every morning.    Historical Provider, MD  omeprazole (PRILOSEC) 20 MG capsule Take 20 mg by mouth daily.    Historical Provider, MD  oxybutynin (DITROPAN) 5 MG tablet Take 1 tablet (5 mg total) by mouth every 6 (six) hours as needed. 06/14/12   Rolan Bucco,  MD  phenazopyridine (PYRIDIUM) 95 MG tablet Take 95 mg by mouth 3 (three) times daily as needed for pain.    Historical Provider, MD  pravastatin (PRAVACHOL) 80 MG tablet Take 80 mg by mouth daily.    Historical Provider, MD  senna-docusate (SENOKOT S) 8.6-50 MG per tablet Take 1 tablet by mouth 2 (two) times daily. 06/14/12   Rolan Bucco, MD    Physical Exam: Vitals:   10/31/15 2353  BP: 128/73  Pulse: 84  Resp: 18  Temp: 98.6 F (37 C)  TempSrc: Oral  SpO2: 98%  Weight: 58.8 kg (129 lb 10.1 oz)  Height: 5\' 7"  (1.702 m)      Constitutional: NAD, calm, comfortable Eyes: PERRL, lids and conjunctivae normal ENMT: Mucous membranes are moist. Posterior pharynx clear of any exudate or lesions.Normal dentition.  Neck: normal, supple, no masses, no thyromegaly Respiratory: clear to auscultation bilaterally, no wheezing, no crackles. Normal respiratory effort. No accessory muscle use.  Cardiovascular: Regular rate and rhythm, no murmurs / rubs / gallops. No extremity edema. 2+ pedal pulses. No carotid bruits.  Abdomen: no tenderness, no masses palpated. No hepatosplenomegaly. Bowel sounds positive.  Musculoskeletal: no clubbing / cyanosis. No joint deformity upper and lower extremities. Good ROM, no contractures. Normal muscle tone.  Skin: no rashes, lesions, ulcers. No induration Neurologic: CN 2-12 grossly intact. Sensation intact, DTR normal. Strength 5/5 in all 4.  Psychiatric: oriented x3, but forgetful, couldn't tell me that she had additional episode of bloody BM that they had just cleaned up after arrival.   Labs on Admission: I have personally reviewed following labs and imaging studies  CBC:  Recent Labs Lab 10/31/15 1817  WBC 7.5  HGB 13.9  HCT 41.1  MCV 94.7  PLT [id number]   Basic Metabolic Panel:  Recent Labs Lab 10/31/15 1817  NA 137  K 4.1  CL 106  CO2 26  GLUCOSE 129*  BUN 24*  CREATININE 0.85  CALCIUM 9.4   GFR: Estimated Creatinine Clearance: 39.2  mL/min (by C-G formula based on SCr of 0.85 mg/dL). Liver Function Tests:  Recent Labs Lab 10/31/15 1817  AST 19  ALT 17  ALKPHOS 66  BILITOT 0.5  PROT 6.6  ALBUMIN 3.7   No results for input(s): LIPASE, AMYLASE in the last 168 hours. No results for input(s): AMMONIA in the last 168 hours. Coagulation Profile:  Recent Labs Lab 10/31/15 1817  INR 0.94   Cardiac Enzymes: No results for input(s): CKTOTAL, CKMB, CKMBINDEX, TROPONINI in the last 168 hours. BNP (last 3 results) No results for input(s): PROBNP in the last 8760 hours. HbA1C: No results for input(s): HGBA1C in the last 72 hours. CBG: No results for input(s): GLUCAP in the last 168 hours. Lipid Profile: No results for input(s): CHOL, HDL, LDLCALC, TRIG, CHOLHDL, LDLDIRECT  in the last 72 hours. Thyroid Function Tests: No results for input(s): TSH, T4TOTAL, FREET4, T3FREE, THYROIDAB in the last 72 hours. Anemia Panel: No results for input(s): VITAMINB12, FOLATE, FERRITIN, TIBC, IRON, RETICCTPCT in the last 72 hours. Urine analysis:    Component Value Date/Time   COLORURINE YELLOW (A) 10/07/2015 1522   APPEARANCEUR HAZY (A) 10/07/2015 1522   LABSPEC 1.019 10/07/2015 1522   PHURINE 6.0 10/07/2015 1522   GLUCOSEU NEGATIVE 10/07/2015 1522   HGBUR NEGATIVE 10/07/2015 1522   BILIRUBINUR NEGATIVE 10/07/2015 1522   KETONESUR NEGATIVE 10/07/2015 1522   PROTEINUR 30 (A) 10/07/2015 1522   UROBILINOGEN 0.2 05/14/2009 1109   NITRITE NEGATIVE 10/07/2015 1522   LEUKOCYTESUR NEGATIVE 10/07/2015 1522   Sepsis Labs: @LABRCNTIP (procalcitonin:4,lacticidven:4) )No results found for this or any previous visit (from the past 240 hour(s)).   Radiological Exams on Admission: No results found.  EKG: Independently reviewed.  Assessment/Plan Principal Problem:   BRBPR (bright red blood per rectum) Active Problems:   Alzheimer's disease    1. BRBPR - 1. Tele monitor 2. Repeat CBC in AM 3. Call GI in AM 4. Last  colonoscopy was 2009 5. Transfuse if needed but HGB 13.9 earlier this evening. 2. Alzheimers - 1. Continue home meds   DVT prophylaxis: SCDs Code Status: Full Family Communication: No family in room Consults called: None Admission status: Admit to obs   Keiry Kowal, [facility] Pager [phone] from 7PM-7AM  If 7AM-7PM, please contact the day physician for the patient [email or web address] Password [email or web address]  11/01/2015, 12:01 AM

## 2015-11-01 NOTE — Progress Notes (Signed)
PROGRESS NOTE    Darlene Paul  [id number] DOB: 12/24/1921 DOA: 10/31/2015 PCP: Idelle Crouch, MD   Brief Narrative: Darlene Paul is a 82 year old female with a history to diverticulosis. She presented to the emergency department with bright red blood per rectum. She was transferred from [facility]. Hemoglobin was initially 13.9 and decreased to 11.4 on arrival to [facility].   Assessment & Plan:   Principal Problem:   BRBPR (bright red blood per rectum) Active Problems:   Alzheimer's disease   Bright red blood per rectum Likely diverticular. She has a previous history of colonoscopy significant for diverticulosis and polyps in 2009. Hemoglobin somewhat stable at the time. Vital signs are stable. She is having what have been described as frankly bloody stools -GI consult -CBC every 12 hours  Dementia -Monitor  Hyperlipidemia -Continue pravastatin 80 mg daily   DVT prophylaxis: SCDs  Code Status: Full code  Family Communication: None at bedside  Disposition Plan: Likely home in 1-2 days depending on resolution of this GI bleed. Concern that she happily could worsen and require aggressive care if discharged early   Consultants:   Gastroenterology; [facility]   Procedures:  None  Antimicrobials:  None    Subjective: Patient reports no problems overnight. She states that she is here because of her GI bleeds.   Objective: Vitals:   10/31/15 2353 11/01/15 0644 11/01/15 0810  BP: 128/73 125/68 127/60  Pulse: 84 98 66  Resp: 18 20 18   Temp: 98.6 F (37 C) 98.4 F (36.9 C) 98.4 F (36.9 C)  TempSrc: Oral Oral Oral  SpO2: 98% 99% 97%  Weight: 58.8 kg (129 lb 10.1 oz)    Height: 5\' 7"  (1.702 m)      Intake/Output Summary (Last 24 hours) at 11/01/15 1444 Last data filed at 11/01/15 1046  Gross per 24 hour  Intake                0 ml  Output                0 ml  Net                0 ml   Filed Weights   10/31/15 2353  Weight: 58.8 kg (129 lb 10.1 oz)     Examination:  General exam: Appears calm and comfortable Respiratory system: Clear to auscultation. Respiratory effort normal. Cardiovascular system: S1 & S2 heard, RRR. No murmurs, rubs, gallops or clicks. Gastrointestinal system: Abdomen is nondistended, soft and nontender. No organomegaly or masses felt. Normal bowel sounds heard. Central nervous system: Alert and oriented x3 only. No focal neurological deficits. Extremities: No cyanosis. No edema  Skin: No rashes, lesions or ulcers Psychiatry: Affect is flat.      Data Reviewed: I have personally reviewed following labs and imaging studies  CBC:  Recent Labs Lab 10/31/15 1817 11/01/15 0201  WBC 7.5 6.0  HGB 13.9 11.4*  HCT 41.1 35.1*  MCV 94.7 95.4  PLT 264 [id number]   Basic Metabolic Panel:  Recent Labs Lab 10/31/15 1817  NA 137  K 4.1  CL 106  CO2 26  GLUCOSE 129*  BUN 24*  CREATININE 0.85  CALCIUM 9.4   GFR: Estimated Creatinine Clearance: 39.2 mL/min (by C-G formula based on SCr of 0.85 mg/dL). Liver Function Tests:  Recent Labs Lab 10/31/15 1817  AST 19  ALT 17  ALKPHOS 66  BILITOT 0.5  PROT 6.6  ALBUMIN 3.7   No results for input(s):  LIPASE, AMYLASE in the last 168 hours. No results for input(s): AMMONIA in the last 168 hours. Coagulation Profile:  Recent Labs Lab 10/31/15 1817  INR 0.94   Cardiac Enzymes: No results for input(s): CKTOTAL, CKMB, CKMBINDEX, TROPONINI in the last 168 hours. BNP (last 3 results) No results for input(s): PROBNP in the last 8760 hours. HbA1C: No results for input(s): HGBA1C in the last 72 hours. CBG: No results for input(s): GLUCAP in the last 168 hours. Lipid Profile: No results for input(s): CHOL, HDL, LDLCALC, TRIG, CHOLHDL, LDLDIRECT in the last 72 hours. Thyroid Function Tests: No results for input(s): TSH, T4TOTAL, FREET4, T3FREE, THYROIDAB in the last 72 hours. Anemia Panel: No results for input(s): VITAMINB12, FOLATE, FERRITIN, TIBC, IRON,  RETICCTPCT in the last 72 hours. Sepsis Labs: No results for input(s): PROCALCITON, LATICACIDVEN in the last 168 hours.  No results found for this or any previous visit (from the past 240 hour(s)).       Radiology Studies: No results found.      Scheduled Meds: . calcium-vitamin D  1 tablet Oral BID WC  . donepezil  10 mg Oral QHS  . pantoprazole  40 mg Oral Daily  . pravastatin  80 mg Oral Daily  . risperiDONE  1 mg Oral QHS  . sodium chloride flush  3 mL Intravenous Q12H   Continuous Infusions:    LOS: 0 days     Cordelia Poche, MD [facility] 11/01/2015, 2:44 PM Pager: [phone]  If 7PM-7AM, please contact night-coverage [email or web address] Password [email or web address] 11/01/2015, 2:44 PM

## 2015-11-01 NOTE — Care Management Obs Status (Signed)
[place] NOTIFICATION   Patient Details  Name: Darlene Paul MRN: [id number] Date of Birth: [birth year]   Medicare Observation Status Notification Given:  Yes (obs letter explained, patient's daughter signed)    Apolonio Schneiders, RN 11/01/2015, 7:42 PM

## 2015-11-01 NOTE — Progress Notes (Signed)
Patient had couple of BM very tiny ones, nurse tech could see it on the wash cloth only when she was wiping.  Color of the stool was more lighter.  Thanks

## 2015-11-02 DIAGNOSIS — K625 Hemorrhage of anus and rectum: Secondary | ICD-10-CM | POA: Diagnosis not present

## 2015-11-02 DIAGNOSIS — F0281 Dementia in other diseases classified elsewhere with behavioral disturbance: Secondary | ICD-10-CM | POA: Diagnosis not present

## 2015-11-02 DIAGNOSIS — G301 Alzheimer's disease with late onset: Secondary | ICD-10-CM | POA: Diagnosis not present

## 2015-11-02 LAB — CBC
HCT: 35.7 % — ABNORMAL LOW (ref 36.0–46.0)
HEMATOCRIT: 34.4 % — AB (ref 36.0–46.0)
HEMOGLOBIN: 11 g/dL — AB (ref 12.0–15.0)
HEMOGLOBIN: 11.8 g/dL — AB (ref 12.0–15.0)
MCH: 30.6 pg (ref 26.0–34.0)
MCH: 31.2 pg (ref 26.0–34.0)
MCHC: 32 g/dL (ref 30.0–36.0)
MCHC: 33.1 g/dL (ref 30.0–36.0)
MCV: 95.6 fL (ref 78.0–100.0)
MCV: 94.4 fL (ref 78.0–100.0)
Platelets: 250 10*3/uL (ref 150–400)
Platelets: 251 10*3/uL (ref 150–400)
RBC: 3.6 MIL/uL — ABNORMAL LOW (ref 3.87–5.11)
RBC: 3.78 MIL/uL — ABNORMAL LOW (ref 3.87–5.11)
RDW: 13.8 % (ref 11.5–15.5)
RDW: 13.7 % (ref 11.5–15.5)
WBC: 6.8 10*3/uL (ref 4.0–10.5)
WBC: 7.8 10*3/uL (ref 4.0–10.5)

## 2015-11-02 NOTE — Care Management Note (Addendum)
Case Management Note  Patient Details  Name: Darlene Paul MRN: [id number] Date of Birth: [birth year]  Subjective/Objective:      CM following for progression and d/c planning.               Action/Plan: 11/02/15 Per PT recommendation, this pt needs HHPT. MD notified and orders requested. Pt daughter has selected [facility] for [facility] services. This CM notified [place]. Pt will d/c to daughters home:  [address], [place] , [place] [address] Daughter; [name]  Ph [phone] Expected Discharge Date:                  Expected Discharge Plan:  [place]  In-House Referral:  NA  Discharge planning Services  CM Consult  Post Acute Care Choice:  Home Health Choice offered to:  Adult Children  DME Arranged:  N/A, per daughter this pt has walkers, bedside commode and shower chair, no other needs identified.  DME Agency:   NA  HH Arranged:  PT,OT [place] Agency:  [place]  Status of Service:  Completed, signed off  If discussed at [place] of Stay Meetings, dates discussed:    Additional Comments:  Adron Bene, RN 11/02/2015, 4:47 PM

## 2015-11-02 NOTE — Evaluation (Signed)
Physical Therapy Evaluation Patient Details Name: Darlene Paul MRN: [id number] DOB: 03-02-1921 Today's Date: 11/02/2015   History of Present Illness  Patient is a 82 yo female admitted 10/31/15 with lower GIB (BRBPR).   PMH:  Alzheimer's dementia, arthritis, HOH (wears hearing aids), HTN, macular degeneration    Clinical Impression  Patient presents with problems listed below.  Will benefit from acute PT to maximize functional mobility prior to discharge to daughter's home.  Recommend patient have assist with mobility/gait due to decreased balance and safety awareness.  Recommend use of RW for gait.  Recommend f/u HHPT at discharge for continued therapy.    Follow Up Recommendations Home health PT;Supervision for mobility/OOB    Equipment Recommendations  None recommended by PT    Recommendations for Other Services       Precautions / Restrictions Precautions Precautions: Fall Restrictions Weight Bearing Restrictions: No      Mobility  Bed Mobility Overal bed mobility: Needs Assistance Bed Mobility: Supine to Sit;Sit to Supine     Supine to sit: Min assist Sit to supine: Min guard   General bed mobility comments: Patient able to move toward sitting position.  Required assist to scoot hips toward EOB to get feet to floor.  Patient fatigues in sitting, with posterior lean.  Patient able to move to supine position with assist for safety only.  Transfers Overall transfer level: Needs assistance Equipment used: Rolling walker (2 wheeled) Transfers: Sit to/from Stand Sit to Stand: Min assist         General transfer comment: Verbal and tactile cues for hand placement.  Assist to rise to standing and for balance iniitially.  Ambulation/Gait Ambulation/Gait assistance: Min assist Ambulation Distance (Feet): 40 Feet (20' x2 with sitting rest break) Assistive device: Rolling walker (2 wheeled) Gait Pattern/deviations: Step-through pattern;Decreased stride  length;Shuffle;Trunk flexed Gait velocity: decreased Gait velocity interpretation: Below normal speed for age/gender General Gait Details: Verbal cues for safe use of RW and to look up during gait.  Assist to maneuver RW during turns.  Patient with improved balance with use of RW.  Stairs            Wheelchair Mobility    Modified Rankin (Stroke Patients Only)       Balance Overall balance assessment: Needs assistance Sitting-balance support: No upper extremity supported;Feet supported Sitting balance-Leahy Scale: Fair Sitting balance - Comments: Requires assist with dynamic  activities due to posterior lean. Postural control: Posterior lean Standing balance support: Bilateral upper extremity supported Standing balance-Leahy Scale: Poor                               Pertinent Vitals/Pain Pain Assessment: No/denies pain    Home Living Family/patient expects to be discharged to:: Private residence Living Arrangements: Children (Will go to daughter's home) Available Help at Discharge: Family;Available 24 hours/day Type of Home: House Home Access: Stairs to enter Entrance Stairs-Rails: Psychiatric nurse of Steps: 3 Home Layout: One level Home Equipment: Walker - 2 wheels;Cane - single point;Shower seat;Bedside commode      Prior Function Level of Independence: Independent;Needs assistance   Gait / Transfers Assistance Needed: Patient ambulates with no assistive device.  ADL's / Homemaking Assistance Needed: Assist with meal prep, bathing, housekeeping.  Comments: Patient reports independent with all ADL's - corrected by daughter.     Hand Dominance   Dominant Hand: Right    Extremity/Trunk Assessment   Upper Extremity Assessment: Generalized  weakness           Lower Extremity Assessment: Generalized weakness         Communication   Communication: HOH  Cognition Arousal/Alertness: Awake/alert Behavior During Therapy:  WFL for tasks assessed/performed;Flat affect Overall Cognitive Status: History of cognitive impairments - at baseline (Disoriented; Difficulty giving PLOF information)                      General Comments      Exercises        Assessment/Plan    PT Assessment Patient needs continued PT services  PT Diagnosis Difficulty walking;Generalized weakness;Altered mental status   PT Problem List Decreased strength;Decreased activity tolerance;Decreased balance;Decreased mobility;Decreased cognition;Decreased knowledge of use of DME;Decreased safety awareness  PT Treatment Interventions DME instruction;Gait training;Functional mobility training;Therapeutic activities;Therapeutic exercise;Patient/family education   PT Goals (Current goals can be found in the Care Plan section) Acute Rehab PT Goals Patient Stated Goal: None stated PT Goal Formulation: With patient/family Time For Goal Achievement: 11/09/15 Potential to Achieve Goals: Good    Frequency Min 3X/week   Barriers to discharge   Patient lives alone.  Has been staying with daughter 2 weeks pta.  Patient will return to daughter's home for 24 hour assist at d/c.    Co-evaluation               End of Session Equipment Utilized During Treatment: Gait belt Activity Tolerance: Patient tolerated treatment well Patient left: in bed;with call bell/phone within reach;with bed alarm set;with family/visitor present Nurse Communication: Mobility status (Recommend use of RW and HHPT)    Functional Assessment Tool Used: Clinical judgement Functional Limitation: Mobility: Walking and moving around Mobility: Walking and Moving Around Current Status [id number]): At least 20 percent but less than 40 percent impaired, limited or restricted Mobility: Walking and Moving Around Goal Status [phone]): At least 1 percent but less than 20 percent impaired, limited or restricted    Time: [id number] PT Time Calculation (min) (ACUTE ONLY): 28  min   Charges:   PT Evaluation $PT Eval Moderate Complexity: 1 Procedure PT Treatments $Gait Training: 8-22 mins   PT G Codes:   PT G-Codes **NOT FOR INPATIENT CLASS** Functional Assessment Tool Used: Clinical judgement Functional Limitation: Mobility: Walking and moving around Mobility: Walking and Moving Around Current Status [id number]): At least 20 percent but less than 40 percent impaired, limited or restricted Mobility: Walking and Moving Around Goal Status [phone]): At least 1 percent but less than 20 percent impaired, limited or restricted    Despina Pole 11/02/2015, 5:37 PM Carita Pian. Sanjuana Kava, [place] Pager [phone]

## 2015-11-02 NOTE — Plan of Care (Signed)
Problem: Health Behavior/Discharge Planning: Goal: Ability to manage health-related needs will improve Outcome: Completed/Met Date Met: 11/02/15 Home Health/ PT

## 2015-11-02 NOTE — Progress Notes (Signed)
[facility] Progress Note  Subjective: Patient without new complaints. One small bowel movement charted yesterday lighter in color  Objective: Vital signs in last 24 hours: Temp:  [98.2 F (36.8 C)-98.9 F (37.2 C)] 98.7 F (37.1 C) (09/08 0757) Pulse Rate:  [67-83] 67 (09/08 0757) Resp:  [16-18] 18 (09/08 0757) BP: (105-125)/(49-59) 121/49 (09/08 0757) SpO2:  [96 %-98 %] 96 % (09/08 0757) Weight:  [58.7 kg (129 lb 6.4 oz)] 58.7 kg (129 lb 6.4 oz) (09/07 2126) Weight change: -0.105 kg (-3.7 oz)   PE: Unchanged  Lab Results: Results for orders placed or performed during the hospital encounter of 10/31/15 (from the past 24 hour(s))  CBC     Status: Abnormal   Collection Time: 11/01/15  6:05 PM  Result Value Ref Range   WBC 6.9 4.0 - 10.5 K/uL   RBC 3.63 (L) 3.87 - 5.11 MIL/uL   Hemoglobin 11.3 (L) 12.0 - 15.0 g/dL   HCT 35.0 (L) 36.0 - 46.0 %   MCV 96.4 78.0 - 100.0 fL   MCH 31.1 26.0 - 34.0 pg   MCHC 32.3 30.0 - 36.0 g/dL   RDW 13.8 11.5 - 15.5 %   Platelets 257 150 - 400 K/uL  CBC     Status: Abnormal   Collection Time: 11/02/15  5:02 AM  Result Value Ref Range   WBC 6.8 4.0 - 10.5 K/uL   RBC 3.60 (L) 3.87 - 5.11 MIL/uL   Hemoglobin 11.0 (L) 12.0 - 15.0 g/dL   HCT 34.4 (L) 36.0 - 46.0 %   MCV 95.6 78.0 - 100.0 fL   MCH 30.6 26.0 - 34.0 pg   MCHC 32.0 30.0 - 36.0 g/dL   RDW 13.8 11.5 - 15.5 %   Platelets 250 150 - 400 K/uL    Studies/Results: No results found.    Assessment: Lower GI bleed apparently self-limiting. Hemoglobin stable  Plan: Expectant management in this 82 year old patient with dementia. We'll sign off for now. Please call if further GI input needed.    Tomislav Micale C 11/02/2015, 9:18 AM  Pager [phone] If no answer or after 5 PM call [phone]

## 2015-11-02 NOTE — Progress Notes (Signed)
PROGRESS NOTE    Darlene Paul  [id number] DOB: [birth year] DOA: 10/31/2015 PCP: Idelle Crouch, MD   Brief Narrative: Darlene Paul is a 82 year old female with a history to diverticulosis. She presented to the emergency department with bright red blood per rectum. She was transferred from [facility]. Hemoglobin was initially 13.9 and decreased to 11.4 on arrival to [facility].   Assessment & Plan:   Principal Problem:   BRBPR (bright red blood per rectum) Active Problems:   Alzheimer's disease   Late onset Alzheimer's disease with behavioral disturbance   Bright red blood per rectum Likely diverticular. She has a previous history of colonoscopy significant for diverticulosis and polyps in 2009. Hemoglobin somewhat stable at the time. Vital signs are stable. She is having what have been described as frankly bloody stools. Initially thought to have slowed down but, per nurse tech report, stool is bloodier than yesterday. Hemoglobin is stable. -continue CBC every 12 hours  Dementia -Monitor  Hyperlipidemia -Continue pravastatin 80 mg daily   DVT prophylaxis: SCDs  Code Status: Full code  Family Communication: None at bedside  Disposition Plan: Planning to discharge tomorrow as GI bleed appears to have increased thig morning.   Consultants:   Gastroenterology; [facility]   Procedures:  None  Antimicrobials:  None    Subjective: Patient reports being hungry. Otherwise, no complaints.  Objective: Vitals:   11/01/15 1651 11/01/15 2126 11/02/15 0458 11/02/15 0757  BP: (!) 112/59 (!) 105/53 (!) 125/52 (!) 121/49  Pulse: 68 83 73 67  Resp: 17 18 16 18   Temp: 98.9 F (37.2 C) 98.2 F (36.8 C) 98.2 F (36.8 C) 98.7 F (37.1 C)  TempSrc: Oral Axillary Oral Oral  SpO2: 97% 96% 98% 96%  Weight:  58.7 kg (129 lb 6.4 oz)    Height:        Intake/Output Summary (Last 24 hours) at 11/02/15 1313 Last data filed at 11/02/15 1015  Gross per 24 hour  Intake               480 ml  Output              325 ml  Net              155 ml   Filed Weights   10/31/15 2353 11/01/15 2126  Weight: 58.8 kg (129 lb 10.1 oz) 58.7 kg (129 lb 6.4 oz)    Examination:  General exam: Appears calm and comfortable Respiratory system: Clear to auscultation. Respiratory effort normal. Cardiovascular system: S1 & S2 heard, RRR. No murmurs, rubs, gallops or clicks. Gastrointestinal system: Abdomen is nondistended, soft and nontender. No organomegaly or masses felt. Normal bowel sounds heard. Central nervous system: Alert and oriented x3. No focal neurological deficits. Extremities: No cyanosis. No edema  Skin: No rashes, lesions or ulcers Psychiatry: Affect is flat.      Data Reviewed: I have personally reviewed following labs and imaging studies  CBC:  Recent Labs Lab 10/31/15 1817 11/01/15 0201 11/01/15 1805 11/02/15 0502  WBC 7.5 6.0 6.9 6.8  HGB 13.9 11.4* 11.3* 11.0*  HCT 41.1 35.1* 35.0* 34.4*  MCV 94.7 95.4 96.4 95.6  PLT 264 234 257 [id number]   Basic Metabolic Panel:  Recent Labs Lab 10/31/15 1817  NA 137  K 4.1  CL 106  CO2 26  GLUCOSE 129*  BUN 24*  CREATININE 0.85  CALCIUM 9.4   GFR: Estimated Creatinine Clearance: 39.1 mL/min (by C-G formula based on SCr of 0.85  mg/dL). Liver Function Tests:  Recent Labs Lab 10/31/15 1817  AST 19  ALT 17  ALKPHOS 66  BILITOT 0.5  PROT 6.6  ALBUMIN 3.7   No results for input(s): LIPASE, AMYLASE in the last 168 hours. No results for input(s): AMMONIA in the last 168 hours. Coagulation Profile:  Recent Labs Lab 10/31/15 1817  INR 0.94   Cardiac Enzymes: No results for input(s): CKTOTAL, CKMB, CKMBINDEX, TROPONINI in the last 168 hours. BNP (last 3 results) No results for input(s): PROBNP in the last 8760 hours. HbA1C: No results for input(s): HGBA1C in the last 72 hours. CBG: No results for input(s): GLUCAP in the last 168 hours. Lipid Profile: No results for input(s): CHOL, HDL, LDLCALC,  TRIG, CHOLHDL, LDLDIRECT in the last 72 hours. Thyroid Function Tests: No results for input(s): TSH, T4TOTAL, FREET4, T3FREE, THYROIDAB in the last 72 hours. Anemia Panel: No results for input(s): VITAMINB12, FOLATE, FERRITIN, TIBC, IRON, RETICCTPCT in the last 72 hours. Sepsis Labs: No results for input(s): PROCALCITON, LATICACIDVEN in the last 168 hours.  No results found for this or any previous visit (from the past 240 hour(s)).       Radiology Studies: No results found.      Scheduled Meds: . calcium-vitamin D  1 tablet Oral BID WC  . donepezil  10 mg Oral QHS  . pantoprazole  40 mg Oral Daily  . pravastatin  80 mg Oral Daily  . risperiDONE  1 mg Oral QHS  . sodium chloride flush  3 mL Intravenous Q12H   Continuous Infusions:    LOS: 0 days     Cordelia Poche, MD [facility] 11/02/2015, 1:13 PM Pager: [phone]  If 7PM-7AM, please contact night-coverage [email or web address] Password [email or web address] 11/02/2015, 1:13 PM

## 2015-11-02 NOTE — Plan of Care (Signed)
Problem: Activity: Goal: Risk for activity intolerance will decrease Outcome: Completed/Met Date Met: 11/02/15 PT  [place]

## 2015-11-03 ENCOUNTER — Encounter (HOSPITAL_COMMUNITY): Payer: Self-pay | Admitting: *Deleted

## 2015-11-03 DIAGNOSIS — K625 Hemorrhage of anus and rectum: Secondary | ICD-10-CM | POA: Diagnosis not present

## 2015-11-03 DIAGNOSIS — G301 Alzheimer's disease with late onset: Secondary | ICD-10-CM | POA: Diagnosis not present

## 2015-11-03 DIAGNOSIS — F0281 Dementia in other diseases classified elsewhere with behavioral disturbance: Secondary | ICD-10-CM | POA: Diagnosis not present

## 2015-11-03 LAB — CBC
HEMATOCRIT: 34.3 % — AB (ref 36.0–46.0)
HEMOGLOBIN: 11.3 g/dL — AB (ref 12.0–15.0)
MCH: 31.3 pg (ref 26.0–34.0)
MCHC: 32.9 g/dL (ref 30.0–36.0)
MCV: 95 fL (ref 78.0–100.0)
Platelets: 259 10*3/uL (ref 150–400)
RBC: 3.61 MIL/uL — ABNORMAL LOW (ref 3.87–5.11)
RDW: 13.5 % (ref 11.5–15.5)
WBC: 7 10*3/uL (ref 4.0–10.5)

## 2015-11-03 NOTE — Discharge Summary (Signed)
Physician Discharge Summary  Darlene Paul [id number] DOB: 06-26-1921 DOA: 10/31/2015  PCP: Darlene Crouch, MD  Admit date: 10/31/2015 Discharge date: 11/03/2015  Admitted From: Home Disposition:  Home  Recommendations for Outpatient Follow-up:  1. Follow up with PCP in 1 week 2. Repeat CBC (hemoglobin 11.3 on discharge) 3. Stopped medications patient stated she was not using   Discharge Condition: Stable CODE STATUS: Full Code Diet recommendation: Heart Healthy  Brief/Interim Summary: Darlene Paul is a 82 year old female with a history to diverticulosis. She presented to the emergency department with bright red blood per rectum. She was transferred from [facility]. Hemoglobin was initially 13.9 and decreased to 11.4 on arrival to [facility]. GI consulted, however, bleeding stopped and blood count stable.  Discharge Diagnoses:  Principal Problem:   BRBPR (bright red blood per rectum) Active Problems:   Alzheimer's disease   Late onset Alzheimer's disease with behavioral disturbance  Bright red blood per rectum Likely diverticular. She has a previous history of colonoscopy significant for diverticulosis and polyps in 2009. Hemoglobin stabilized and bleeding stopped. Gastroenterology consulted, however, no intervention needed secondary to cessation of GI bleed.  Dementia Continued home donepezil and respiridone  Hyperlipidemia Continued pravastatin 80 mg daily  Discharge Instructions     Medication List    STOP taking these medications   hyoscyamine 0.125 MG tablet Commonly known as:  LEVSIN, ANASPAZ   senna-docusate 8.6-50 MG tablet Commonly known as:  SENOKOT S     TAKE these medications   acetaminophen 500 MG tablet Commonly known as:  TYLENOL Take 500 mg by mouth every 6 (six) hours as needed for moderate pain or headache.   amLODipine 5 MG tablet Commonly known as:  NORVASC Take 5 mg by mouth daily.   aspirin 81 MG tablet Take 81 mg by mouth daily.    calcium-vitamin Paul 500-200 MG-UNIT tablet Take 1 tablet by mouth 2 (two) times daily with a meal.   donepezil 10 MG tablet Commonly known as:  ARICEPT Take 10 mg by mouth daily.   MILK OF MAGNESIA PO Take 15 mLs by mouth as needed (for stomach).   multivitamin tablet Take 1 tablet by mouth daily.   omeprazole 20 MG capsule Commonly known as:  PRILOSEC Take 20 mg by mouth 2 (two) times daily.   pravastatin 80 MG tablet Commonly known as:  PRAVACHOL Take 80 mg by mouth daily.   risperiDONE 1 MG tablet Commonly known as:  RISPERDAL Take 1 mg by mouth daily.      Follow-up Information    Darlene D, MD. Schedule an appointment as soon as possible for a visit in 3 day(s).   Specialty:  Internal Medicine Why:  Hospital follow-up Contact information: [place] [id number] [phone]          Allergies  Allergen Reactions  . Azithromycin Palpitations  . Sulfa Antibiotics Rash    Consultations:  [facility]   Procedures/Studies: Dg Chest 2 View  Result Date: 10/07/2015 CLINICAL DATA:  82 year old female with weakness and malaise. Initial encounter. EXAM: CHEST  2 VIEW COMPARISON:  06/07/2012 and earlier. FINDINGS: Seated AP and lateral views of the chest. Osteopenia. Stable mild compression fracture at the thoracolumbar junction. Calcified aortic atherosclerosis. Borderline to mild cardiomegaly. Chronic gastric hiatal hernia not significantly changed. Chronic pulmonary interstitial markings appear stable. No pneumothorax, pulmonary edema, pleural effusion or confluent pulmonary opacity. Negative visible bowel gas pattern. IMPRESSION: No acute cardiopulmonary abnormality.  Chronic hiatal hernia.  Electronically Signed   By: Genevie Ann M.Paul.   On: 10/07/2015 16:27   Ct Head Wo Contrast  Result Date: 10/07/2015 CLINICAL DATA:  Weakness.  Weight loss. EXAM: CT HEAD WITHOUT CONTRAST TECHNIQUE: Contiguous axial images  were obtained from the base of the skull through the vertex without intravenous contrast. COMPARISON:  08/20/2010 brain MRI can report from head CT from 05/25/1995 FINDINGS: Dense calcification in the left lentiform nucleus, as reported on prior CT from 1997, and with corresponding low signal intensity on the brain MRI from 08/20/2010. Accordingly I suspect that this is a chronic abnormality. Basal ganglia otherwise unremarkable. Periventricular white matter and corona radiata hypodensities favor chronic ischemic microvascular white matter disease. Brainstem and cerebellum unremarkable. No intracranial hemorrhage, mass lesion, acute CVA is identified. There is bony expansion and mildly heterogeneous lucency in the right frontal bone unchanged in distribution from the T2 signal hyperintensity on 08/20/2010, and associated with a 2.9 cm in diameter right frontal craniotomy with a mesh plate in place as shown on image 11/3. IMPRESSION: 1. No acute intracranial findings. 2. Periventricular white matter and corona radiata hypodensities favor chronic ischemic microvascular white matter disease. 3. Chronic dense calcification in the left lentiform nucleus, likely dystrophic or the result of a remote insult in the region. This has been present on prior exams at least back through 1997. 4. Abnormal hypodense lesion of the right frontal calvarium,, with some bony expansion and trabecular accentuation in the diploic space, mild cortical demineralization, and partial removal of the bone with a mesh plate in place. Overall the distribution is not changed from the prior brain MRI from 2012. Accordingly I suspect that this represents a benign process such as fibrous dysplasia or Paget's disease. It was also discussed on the prior head CT from 1997. Correlate with operative history and pathology results. Electronically Signed   By: Van Clines M.Paul.   On: 10/07/2015 16:15      Subjective: Patient reports no problems. No  bloody stools  Discharge Exam: Vitals:   11/03/15 0412 11/03/15 0900  BP: 134/66 128/71  Pulse: 84 87  Resp: 19 20  Temp: 98.2 F (36.8 C) 97.9 F (36.6 C)   Vitals:   11/02/15 1706 11/02/15 2109 11/03/15 0412 11/03/15 0900  BP: 123/62 (!) 113/51 134/66 128/71  Pulse: 70 70 84 87  Resp: 17 19 19 20   Temp: 97.9 F (36.6 C) 98.6 F (37 C) 98.2 F (36.8 C) 97.9 F (36.6 C)  TempSrc: Oral Oral Oral Oral  SpO2: 98% 97% 98% 98%  Weight:  59.3 kg (130 lb 12.8 oz)    Height:        General exam: Appears calm and comfortable Respiratory system: Clear to auscultation. Respiratory effort normal. Cardiovascular system: S1 & S2 heard, RRR. No murmurs, rubs, gallops or clicks. Gastrointestinal system: Abdomen is nondistended, soft and nontender. No organomegaly or masses felt. Normal bowel sounds heard. Central nervous system: Alert and oriented x3. No focal neurological deficits. Extremities: No cyanosis. No edema  Skin: No rashes, lesions or ulcers Psychiatry: Affect is flat.    The results of significant diagnostics from this hospitalization (including imaging, microbiology, ancillary and laboratory) are listed below for reference.     Microbiology: No results found for this or any previous visit (from the past 240 hour(s)).   Labs: BNP (last 3 results) No results for input(s): BNP in the last 8760 hours. Basic Metabolic Panel:  Recent Labs Lab 10/31/15 1817  NA 137  K  4.1  CL 106  CO2 26  GLUCOSE 129*  BUN 24*  CREATININE 0.85  CALCIUM 9.4   Liver Function Tests:  Recent Labs Lab 10/31/15 1817  AST 19  ALT 17  ALKPHOS 66  BILITOT 0.5  PROT 6.6  ALBUMIN 3.7   No results for input(s): LIPASE, AMYLASE in the last 168 hours. No results for input(s): AMMONIA in the last 168 hours. CBC:  Recent Labs Lab 11/01/15 0201 11/01/15 1805 11/02/15 0502 11/02/15 1813 11/03/15 0534  WBC 6.0 6.9 6.8 7.8 7.0  HGB 11.4* 11.3* 11.0* 11.8* 11.3*  HCT 35.1* 35.0*  34.4* 35.7* 34.3*  MCV 95.4 96.4 95.6 94.4 95.0  PLT 234 257 250 251 259   Cardiac Enzymes: No results for input(s): CKTOTAL, CKMB, CKMBINDEX, TROPONINI in the last 168 hours. BNP: Invalid input(s): POCBNP CBG: No results for input(s): GLUCAP in the last 168 hours. Paul-Dimer No results for input(s): DDIMER in the last 72 hours. Hgb A1c No results for input(s): HGBA1C in the last 72 hours. Lipid Profile No results for input(s): CHOL, HDL, LDLCALC, TRIG, CHOLHDL, LDLDIRECT in the last 72 hours. Thyroid function studies No results for input(s): TSH, T4TOTAL, T3FREE, THYROIDAB in the last 72 hours.  Invalid input(s): FREET3 Anemia work up No results for input(s): VITAMINB12, FOLATE, FERRITIN, TIBC, IRON, RETICCTPCT in the last 72 hours. Urinalysis    Component Value Date/Time   COLORURINE YELLOW (A) 10/07/2015 1522   APPEARANCEUR HAZY (A) 10/07/2015 1522   LABSPEC 1.019 10/07/2015 1522   PHURINE 6.0 10/07/2015 1522   GLUCOSEU NEGATIVE 10/07/2015 1522   HGBUR NEGATIVE 10/07/2015 1522   BILIRUBINUR NEGATIVE 10/07/2015 1522   KETONESUR NEGATIVE 10/07/2015 1522   PROTEINUR 30 (A) 10/07/2015 1522   UROBILINOGEN 0.2 05/14/2009 1109   NITRITE NEGATIVE 10/07/2015 1522   LEUKOCYTESUR NEGATIVE 10/07/2015 1522   Sepsis Labs Invalid input(s): PROCALCITONIN,  WBC,  LACTICIDVEN Microbiology No results found for this or any previous visit (from the past 240 hour(s)).   Time coordinating discharge: Over 30 minutes  SIGNED:   Cordelia Poche, MD  [facility] 11/03/2015, 1:22 PM Pager [phone]  If 7PM-7AM, please contact night-coverage [email or web address] Password [email or web address]

## 2015-11-03 NOTE — Discharge Instructions (Addendum)
Darlene Paul, your in the hospital because of your bleeding per rectum. This is likely secondary to something called diverticulosis as you have had this diagnosis for some time. While you're observed, her bleeding stopped and he started to have normal stools. Your blood count was a little bit low were then your normal but leveled off. I recommended follow-up with her primary care physician so they can recheck her blood count in a few days to make sure this continues to be stable. If you have recurrent episodes that do not stop, please do not hesitate to return for reevaluation.

## 2015-11-21 ENCOUNTER — Other Ambulatory Visit: Payer: Self-pay | Admitting: Physician Assistant

## 2015-11-21 DIAGNOSIS — S32010A Wedge compression fracture of first lumbar vertebra, initial encounter for closed fracture: Secondary | ICD-10-CM

## 2015-12-04 ENCOUNTER — Ambulatory Visit
Admission: RE | Admit: 2015-12-04 | Discharge: 2015-12-04 | Disposition: A | Discharge status: Home / Self Care | Payer: Medicare Other | Source: Ambulatory Visit | Attending: Physician Assistant | Admitting: Physician Assistant

## 2015-12-04 DIAGNOSIS — X58XXXA Exposure to other specified factors, initial encounter: Secondary | ICD-10-CM | POA: Diagnosis not present

## 2015-12-04 DIAGNOSIS — S32010A Wedge compression fracture of first lumbar vertebra, initial encounter for closed fracture: Secondary | ICD-10-CM | POA: Insufficient documentation

## 2015-12-04 DIAGNOSIS — D1800 Hemangioma unspecified site: Secondary | ICD-10-CM | POA: Diagnosis not present

## 2015-12-04 DIAGNOSIS — M5126 Other intervertebral disc displacement, lumbar region: Secondary | ICD-10-CM | POA: Insufficient documentation

## 2015-12-05 ENCOUNTER — Encounter
Admission: RE | Admit: 2015-12-05 | Discharge: 2015-12-05 | Disposition: A | Discharge status: Home / Self Care | Payer: Medicare Other | Source: Ambulatory Visit | Attending: Orthopedic Surgery | Admitting: Orthopedic Surgery

## 2015-12-05 DIAGNOSIS — Z882 Allergy status to sulfonamides status: Secondary | ICD-10-CM | POA: Diagnosis not present

## 2015-12-05 DIAGNOSIS — M4856XA Collapsed vertebra, not elsewhere classified, lumbar region, initial encounter for fracture: Secondary | ICD-10-CM | POA: Diagnosis present

## 2015-12-05 DIAGNOSIS — Z853 Personal history of malignant neoplasm of breast: Secondary | ICD-10-CM | POA: Diagnosis not present

## 2015-12-05 DIAGNOSIS — E785 Hyperlipidemia, unspecified: Secondary | ICD-10-CM | POA: Diagnosis not present

## 2015-12-05 DIAGNOSIS — Z85820 Personal history of malignant melanoma of skin: Secondary | ICD-10-CM | POA: Diagnosis not present

## 2015-12-05 DIAGNOSIS — W010XXA Fall on same level from slipping, tripping and stumbling without subsequent striking against object, initial encounter: Secondary | ICD-10-CM | POA: Diagnosis not present

## 2015-12-05 DIAGNOSIS — K449 Diaphragmatic hernia without obstruction or gangrene: Secondary | ICD-10-CM | POA: Diagnosis not present

## 2015-12-05 DIAGNOSIS — Z881 Allergy status to other antibiotic agents status: Secondary | ICD-10-CM | POA: Diagnosis not present

## 2015-12-05 DIAGNOSIS — Z7982 Long term (current) use of aspirin: Secondary | ICD-10-CM | POA: Diagnosis not present

## 2015-12-05 DIAGNOSIS — M810 Age-related osteoporosis without current pathological fracture: Secondary | ICD-10-CM | POA: Diagnosis not present

## 2015-12-05 DIAGNOSIS — Z96652 Presence of left artificial knee joint: Secondary | ICD-10-CM | POA: Diagnosis not present

## 2015-12-05 DIAGNOSIS — D649 Anemia, unspecified: Secondary | ICD-10-CM | POA: Diagnosis not present

## 2015-12-05 DIAGNOSIS — E039 Hypothyroidism, unspecified: Secondary | ICD-10-CM | POA: Diagnosis not present

## 2015-12-05 DIAGNOSIS — I10 Essential (primary) hypertension: Secondary | ICD-10-CM | POA: Diagnosis not present

## 2015-12-05 DIAGNOSIS — K219 Gastro-esophageal reflux disease without esophagitis: Secondary | ICD-10-CM | POA: Diagnosis not present

## 2015-12-05 DIAGNOSIS — F0390 Unspecified dementia without behavioral disturbance: Secondary | ICD-10-CM | POA: Diagnosis not present

## 2015-12-05 DIAGNOSIS — Z923 Personal history of irradiation: Secondary | ICD-10-CM | POA: Diagnosis not present

## 2015-12-05 HISTORY — DX: Nervousness: R45.0

## 2015-12-05 HISTORY — DX: Anemia, unspecified: D64.9

## 2015-12-05 HISTORY — DX: Age-related osteoporosis without current pathological fracture: M81.0

## 2015-12-05 HISTORY — DX: Unspecified dementia, unspecified severity, without behavioral disturbance, psychotic disturbance, mood disturbance, and anxiety: F03.90

## 2015-12-05 LAB — SURGICAL PCR SCREEN
MRSA, PCR: NEGATIVE
Staphylococcus aureus: NEGATIVE

## 2015-12-05 NOTE — Patient Instructions (Signed)
  Your procedure is scheduled on: 01/06/16 Thur Report to Same Day Surgery 2nd floor medical mall at 11:45 am   Remember: Instructions that are not followed completely may result in serious medical risk, up to and including death, or upon the discretion of your surgeon and anesthesiologist your surgery may need to be rescheduled.    _x___ 1. Do not eat food or drink liquids after midnight. No gum chewing or hard candies.     __x__ 2. No Alcohol for 24 hours before or after surgery.   __x__3. No Smoking for 24 prior to surgery.   ____  4. Bring all medications with you on the day of surgery if instructed.    __x__ 5. Notify your doctor if there is any change in your medical condition     (cold, fever, infections).     Do not wear jewelry, make-up, hairpins, clips or nail polish.  Do not wear lotions, powders, or perfumes. You may wear deodorant.  Do not shave 48 hours prior to surgery. Men may shave face and neck.  Do not bring valuables to the hospital.    [facility] is not responsible for any belongings or valuables.               Contacts, dentures or bridgework may not be worn into surgery.  Leave your suitcase in the car. After surgery it may be brought to your room.  For patients admitted to the hospital, discharge time is determined by your treatment team.   Patients discharged the day of surgery will not be allowed to drive home.    Please read over the following fact sheets that you were given:   [facility] Preparing for Surgery and or MRSA Information   _x___ Take these medicines the morning of surgery with A SIP OF WATER:    1. donepezil (ARICEPT) 10 MG tablet  2.omeprazole (PRILOSEC) 20 MG capsule  3.traMADol (ULTRAM) 50 MG tablet if needed  4.risperiDONE (RISPERDAL) 1 MG tablet  5.  6.  ____Fleets enema or Magnesium Citrate as directed.   _x___ Use CHG Soap or sage wipes as directed on instruction sheet   ____ Use inhalers on the day of surgery and bring to  hospital day of surgery  ____ Stop metformin 2 days prior to surgery    ____ Take 1/2 of usual insulin dose the night before surgery and none on the morning of           surgery.   ____ Stop aspirin or coumadin, or plavix  x__ Stop Anti-inflammatories such as Advil, Aleve, Ibuprofen, Motrin, Naproxen,          Naprosyn, Goodies powders or aspirin products. Ok to take Tylenol.   _x___ Stop supplements until after surgery.    ____ Bring C-Pap to the hospital.

## 2015-12-06 ENCOUNTER — Ambulatory Visit: Payer: Medicare Other

## 2015-12-06 ENCOUNTER — Ambulatory Visit: Payer: Medicare Other | Admitting: Certified Registered Nurse Anesthetist

## 2015-12-06 ENCOUNTER — Encounter
Admission: RE | Disposition: A | Discharge status: Home / Self Care | Payer: Self-pay | Source: Ambulatory Visit | Attending: Orthopedic Surgery

## 2015-12-06 ENCOUNTER — Ambulatory Visit
Admission: RE | Admit: 2015-12-06 | Discharge: 2015-12-06 | Disposition: A | Discharge status: Home / Self Care | Payer: Medicare Other | Source: Ambulatory Visit | Attending: Orthopedic Surgery | Admitting: Orthopedic Surgery

## 2015-12-06 DIAGNOSIS — Z419 Encounter for procedure for purposes other than remedying health state, unspecified: Secondary | ICD-10-CM

## 2015-12-06 DIAGNOSIS — Z96652 Presence of left artificial knee joint: Secondary | ICD-10-CM | POA: Insufficient documentation

## 2015-12-06 DIAGNOSIS — K449 Diaphragmatic hernia without obstruction or gangrene: Secondary | ICD-10-CM | POA: Insufficient documentation

## 2015-12-06 DIAGNOSIS — M810 Age-related osteoporosis without current pathological fracture: Secondary | ICD-10-CM | POA: Insufficient documentation

## 2015-12-06 DIAGNOSIS — M4856XA Collapsed vertebra, not elsewhere classified, lumbar region, initial encounter for fracture: Secondary | ICD-10-CM | POA: Insufficient documentation

## 2015-12-06 DIAGNOSIS — F0390 Unspecified dementia without behavioral disturbance: Secondary | ICD-10-CM | POA: Insufficient documentation

## 2015-12-06 DIAGNOSIS — I10 Essential (primary) hypertension: Secondary | ICD-10-CM | POA: Insufficient documentation

## 2015-12-06 DIAGNOSIS — Z881 Allergy status to other antibiotic agents status: Secondary | ICD-10-CM | POA: Insufficient documentation

## 2015-12-06 DIAGNOSIS — Z882 Allergy status to sulfonamides status: Secondary | ICD-10-CM | POA: Insufficient documentation

## 2015-12-06 DIAGNOSIS — E039 Hypothyroidism, unspecified: Secondary | ICD-10-CM | POA: Insufficient documentation

## 2015-12-06 DIAGNOSIS — Z7982 Long term (current) use of aspirin: Secondary | ICD-10-CM | POA: Insufficient documentation

## 2015-12-06 DIAGNOSIS — Z853 Personal history of malignant neoplasm of breast: Secondary | ICD-10-CM | POA: Insufficient documentation

## 2015-12-06 DIAGNOSIS — Z85820 Personal history of malignant melanoma of skin: Secondary | ICD-10-CM | POA: Insufficient documentation

## 2015-12-06 DIAGNOSIS — Z923 Personal history of irradiation: Secondary | ICD-10-CM | POA: Insufficient documentation

## 2015-12-06 DIAGNOSIS — D649 Anemia, unspecified: Secondary | ICD-10-CM | POA: Insufficient documentation

## 2015-12-06 DIAGNOSIS — W010XXA Fall on same level from slipping, tripping and stumbling without subsequent striking against object, initial encounter: Secondary | ICD-10-CM | POA: Insufficient documentation

## 2015-12-06 DIAGNOSIS — K219 Gastro-esophageal reflux disease without esophagitis: Secondary | ICD-10-CM | POA: Insufficient documentation

## 2015-12-06 DIAGNOSIS — E785 Hyperlipidemia, unspecified: Secondary | ICD-10-CM | POA: Insufficient documentation

## 2015-12-06 HISTORY — PX: KYPHOPLASTY: SHX5884

## 2015-12-06 SURGERY — KYPHOPLASTY
Anesthesia: General | Site: Spine Lumbar | Wound class: Clean

## 2015-12-06 MED ORDER — IOPAMIDOL (ISOVUE-M 200) INJECTION 41%
INTRAMUSCULAR | Status: AC
  Filled 2015-12-06: qty 20

## 2015-12-06 MED ORDER — IOPAMIDOL (ISOVUE-M 200) INJECTION 41%
INTRAMUSCULAR | Status: DC | PRN
  Administered 2015-12-06: 20 mL

## 2015-12-06 MED ORDER — FENTANYL CITRATE (PF) 100 MCG/2ML IJ SOLN
INTRAMUSCULAR | Status: DC | PRN
  Administered 2015-12-06 (×4): 25 ug via INTRAVENOUS

## 2015-12-06 MED ORDER — FENTANYL CITRATE (PF) 100 MCG/2ML IJ SOLN: 25.0000 ug | INTRAMUSCULAR | Status: DC | PRN

## 2015-12-06 MED ORDER — CEFAZOLIN SODIUM-DEXTROSE 2-4 GM/100ML-% IV SOLN
2.0000 g | Freq: Once | INTRAVENOUS | Status: AC
  Administered 2015-12-06: 2 g via INTRAVENOUS

## 2015-12-06 MED ORDER — LACTATED RINGERS IV SOLN
INTRAVENOUS | Status: DC
  Administered 2015-12-06: 12:00:00 via INTRAVENOUS

## 2015-12-06 MED ORDER — LIDOCAINE HCL (CARDIAC) 20 MG/ML IV SOLN
INTRAVENOUS | Status: DC | PRN
  Administered 2015-12-06: 60 mg via INTRAVENOUS

## 2015-12-06 MED ORDER — BUPIVACAINE-EPINEPHRINE (PF) 0.5% -1:200000 IJ SOLN
INTRAMUSCULAR | Status: DC | PRN
  Administered 2015-12-06: 10 mL

## 2015-12-06 MED ORDER — PROPOFOL 500 MG/50ML IV EMUL
INTRAVENOUS | Status: DC | PRN
  Administered 2015-12-06: 55 ug/kg/min via INTRAVENOUS

## 2015-12-06 MED ORDER — ONDANSETRON HCL 4 MG/2ML IJ SOLN: 4.0000 mg | Freq: Once | INTRAMUSCULAR | Status: DC | PRN

## 2015-12-06 MED ORDER — LIDOCAINE HCL 1 % IJ SOLN
INTRAMUSCULAR | Status: DC | PRN
  Administered 2015-12-06: 20 mL

## 2015-12-06 MED ORDER — LIDOCAINE HCL (PF) 1 % IJ SOLN
INTRAMUSCULAR | Status: AC
  Filled 2015-12-06: qty 60

## 2015-12-06 MED ORDER — BUPIVACAINE-EPINEPHRINE (PF) 0.5% -1:200000 IJ SOLN
INTRAMUSCULAR | Status: AC
  Filled 2015-12-06: qty 30

## 2015-12-06 SURGICAL SUPPLY — 15 items
CEMENT BONE KYPHON CDS (Cement) ×3 IMPLANT
DEVICE BIOPSY BONE KYPHX (INSTRUMENTS) ×3 IMPLANT
DRAPE C-ARM XRAY 36X54 (DRAPES) ×3 IMPLANT
DURAPREP 26ML APPLICATOR (WOUND CARE) ×3 IMPLANT
GLOVE SURG SYN 9.0  PF PI (GLOVE) ×2
GLOVE SURG SYN 9.0 PF PI (GLOVE) ×1 IMPLANT
GOWN SRG 2XL LVL 4 RGLN SLV (GOWNS) ×1 IMPLANT
GOWN STRL NON-REIN 2XL LVL4 (GOWNS) ×2
GOWN STRL REUS W/ TWL LRG LVL3 (GOWN DISPOSABLE) ×1 IMPLANT
GOWN STRL REUS W/TWL LRG LVL3 (GOWN DISPOSABLE) ×2
LIQUID BAND (GAUZE/BANDAGES/DRESSINGS) ×3 IMPLANT
PACK KYPHOPLASTY (MISCELLANEOUS) ×3 IMPLANT
STRAP SAFETY BODY (MISCELLANEOUS) ×3 IMPLANT
TRAY KYPHOPAK 15/3 EXPRESS 1ST (MISCELLANEOUS) ×3 IMPLANT
TRAY KYPHOPAK 20/3 EXPRESS 1ST (MISCELLANEOUS) IMPLANT

## 2015-12-06 NOTE — H&P (Signed)
Chief Complaint  Patient presents with  . Back Pain - Lumbar Area    L1 Compression Fracture.    History of the Present Illness: Darlene Paul is a 82 y.o. female here for evaluation of an L1 compression fracture.  The patient had an MRI at [facility] yesterday 12/04/2015 that showed a subacute L1 fracture.  Prior x-rays obtained at [facility] also showed this.    The patient reports her pain level today is 6/10.  She reports that she fell and injured her back over a month ago.  The patient had to move in with her daughter for care due to her back pain.  I have reviewed past medical, surgical, social and family history, and allergies as documented in the EMR.   Past Medical History:     Past Medical History:  Diagnosis Date  . Anemia, unspecified   . Cataracts, bilateral   . Chickenpox   . Dementia   . Essential hypertension, benign   . GERD (gastroesophageal reflux disease)   . Malignant neoplasm of breast (female), unspecified site (CMS-HCC)   . Melanoma (CMS-HCC)   . Osteoporosis, post-menopausal   . Other and unspecified hyperlipidemia   . Ovarian cyst   . Phlebitis   . Schatzki's ring    underlying etiological cause of dysphagia  . Status post radiation therapy    thyroid nodules  . Unspecified essential hypertension   . Unspecified hypothyroidism     Past Surgical History:      Past Surgical History:  Procedure Laterality Date  . APPENDECTOMY    . CATARACT EXTRACTION    . COLONOSCOPY  10/19/2007  . CRANIOPLASTY Right    for Paget's disease vs Cancer. Final Dx: benign "patch blood vessels" 1997  . EGD  04/15/2006   08/13/2004, 03/12/1998  . EXCISION MALIGNANT LESION FACE     Forehead (Melanoma)  . JOINT REPLACEMENT    . MASTECTOMY     Right  . OOPHORECTOMY    . REPLACEMENT TOTAL KNEE Left     Past Family History:  FamilyHistory        Family History  Problem Relation Age of Onset  . Hypertension Mother    . Heart attack Mother   . Coronary artery disease Mother   . Kidney disease Father   . Alzheimer's disease Sister   . Diabetes mellitus Brother   . Kidney disease Brother       Medications:       Current Outpatient Prescriptions Ordered in Epic  Medication Sig Dispense Refill  . amLODIPine (NORVASC) 5 MG tablet Take 1 tablet by mouth once a day 90 tablet 1  . aspirin 81 MG EC tablet Take 81 mg by mouth once daily.    . calcitonin, salmon, (MIACALCIN) 200 unit/actuation nasal spray Place 1 spray into the left nostril once daily. Alternate nostrils every other day 3.7 mL 2  . calcium citrate-vitamin D3 (CITRACAL+D) 315-200 mg-unit tablet Take 2 tablets by mouth 2 (two) times daily with meals.    . clotrimazole-betamethasone (LOTRISONE) 1-0.05 % cream Apply topically 2 (two) times daily. 45 g 0  . donepezil (ARICEPT) 10 MG tablet Take 1 tablet (10 mg total) by mouth once daily. 90 tablet 1  . magnesium hydroxide (MILK OF MAGNESIA) 400 mg/5 mL suspension Take by mouth once daily as needed for Constipation.    . multivitamin tablet Take 1 tablet by mouth once daily.    Marland Kitchen omega-3 fatty acids/fish oil 340-1,000 mg capsule Take 1  capsule by mouth 2 (two) times daily.    Marland Kitchen omeprazole (PRILOSEC) 20 MG DR capsule Take 1 capsule by mouth  twice daily 180 capsule 0  . pravastatin (PRAVACHOL) 80 MG tablet Take 1 tablet by mouth once a day 90 tablet 1  . risperiDONE (RISPERDAL) 1 MG tablet Take 1 tablet (1 mg total) by mouth nightly. 90 tablet 1  . traMADol (ULTRAM) 50 mg tablet Take 1 tablet (50 mg total) by mouth every 6 (six) hours as needed for Pain for up to 10 days. Try 1/2 pill at first. 30 tablet 0   No current Epic-ordered facility-administered medications on file.     Allergies:      Allergies  Allergen Reactions  . Azithromycin (Bulk) Other (See Comments)    Change in heart rate  . Azithromycin Palpitations  . Sulfa (Sulfonamide Antibiotics) Rash  .  Sulfacetamide Sodium Rash     Body mass index is 23.59 kg/(m^2).   Review of Systems: A comprehensive 14 point ROS was performed, reviewed, and the pertinent orthopaedic findings are documented in the HPI.      Vitals:   12/05/15 1006  BP: (!) 130/90    General Physical Examination:  General/Constitutional: No apparent distress: well-nourished and well developed. Eyes: Pupils equal, round with synchronous movement. Lungs: Clear to auscultation HEENT: Normal Vascular: No edema, swelling or tenderness, except as noted in detailed exam. Cardiac:  Heart rate and rhythm is regular. Integumentary: No impressive skin lesions present, except as noted in detailed exam. Neuro/Psych: Normal mood and affect, oriented to person, place and time.   Musculoskeletal Examination: On exam, the patient is tender to percussion over the L1 fracture site.  The patient has no clonus on exam.  No neurodeficit.   Lungs are clear.  Heart rate and rhythm is normal.  HEENT is marked for upper and lower dentures.   Radiographs: The patient had x-rays ordered by Paulita Cradle, PA on 11/19/2015 and I reviewed these today as well.  These show the L1 fracture.  The patient has a chronic T12 fracture as well.   Assessment: L1 compression fracture CM   PL                 1.   Closed wedge compression fracture of first lumbar vertebra, initial encounter (CMS-HCC)    S32.010A 805.4     Plan: The patient has clinical findings of an L1 compression fracture that is over 86 month old with persistent pain.  This has also decreased her level of function.    I recommend the patient have a kyphoplasty procedure at L1.  This will be tentatively scheduled for tomorrow 12/06/2015.   Scribe Attestation: I, Candie Mile, am acting as scribe for [employer], MD

## 2015-12-06 NOTE — Anesthesia Preprocedure Evaluation (Signed)
Anesthesia Evaluation  Patient identified by MRN, date of birth, ID band Patient awake  General Assessment Comment:Pt with mild confusion and memory difficulties  Reviewed: Allergy & Precautions, NPO status , Patient's Chart, lab work & pertinent test results  History of Anesthesia Complications Negative for: history of anesthetic complications  Airway Mallampati: II       Dental  (+) Upper Dentures, Lower Dentures   Pulmonary neg pulmonary ROS,           Cardiovascular hypertension, Pt. on medications      Neuro/Psych negative neurological ROS     GI/Hepatic Neg liver ROS, hiatal hernia, GERD  Medicated,  Endo/Other  Hypothyroidism   Renal/GU negative Renal ROS     Musculoskeletal   Abdominal   Peds  Hematology  (+) anemia ,   Anesthesia Other Findings   Reproductive/Obstetrics                             Anesthesia Physical Anesthesia Plan  ASA: III  Anesthesia Plan: General   Post-op Pain Management:    Induction:   Airway Management Planned:   Additional Equipment:   Intra-op Plan:   Post-operative Plan:   Informed Consent: I have reviewed the patients History and Physical, chart, labs and discussed the procedure including the risks, benefits and alternatives for the proposed anesthesia with the patient or authorized representative who has indicated his/her understanding and acceptance.     Plan Discussed with:   Anesthesia Plan Comments:         Anesthesia Quick Evaluation

## 2015-12-06 NOTE — Anesthesia Postprocedure Evaluation (Signed)
Anesthesia Post Note  Patient: Darlene Paul  Procedure(s) Performed: Procedure(s) (LRB): KYPHOPLASTY L1 (N/A)  Patient location during evaluation: PACU Anesthesia Type: General Level of consciousness: awake and alert Pain management: pain level controlled Vital Signs Assessment: post-procedure vital signs reviewed and stable Respiratory status: spontaneous breathing and respiratory function stable Cardiovascular status: stable Anesthetic complications: no    Last Vitals:  Vitals:   12/06/15 1403 12/06/15 1430  BP: (!) 160/84 (!) 161/82  Pulse: 64 71  Resp: 16 16  Temp: (!) 36 C     Last Pain:  Vitals:   12/06/15 1403  TempSrc: Temporal  PainSc: 0-No pain                 KEPHART,WILLIAM K

## 2015-12-06 NOTE — Discharge Instructions (Addendum)
Remove Band-Aid on Saturday. Activities as tolerated. Okay to shower on Saturday  AMBULATORY SURGERY  DISCHARGE INSTRUCTIONS   1) The drugs that you were given will stay in your system until tomorrow so for the next 24 hours you should not:  A) Drive an automobile B) Make any legal decisions C) Drink any alcoholic beverage   2) You may resume regular meals tomorrow.  Today it is better to start with liquids and gradually work up to solid foods.  You may eat anything you prefer, but it is better to start with liquids, then soup and crackers, and gradually work up to solid foods.   3) Please notify your doctor immediately if you have any unusual bleeding, trouble breathing, redness and pain at the surgery site, drainage, fever, or pain not relieved by medication.    4) Additional Instructions:        Please contact your physician with any problems or Same Day Surgery at [phone], Monday through Friday 6 am to 4 pm, or [place] at [facility] number at [phone].

## 2015-12-06 NOTE — Op Note (Signed)
12/06/2015  1:19 PM  PATIENT:  Darlene Paul  82 y.o. female  PRE-OPERATIVE DIAGNOSIS:  cCLOSED COMPRESSION FRACTURE L1  POST-OPERATIVE DIAGNOSIS:  cCLOSED COMPRESSION FRACTURE L1  PROCEDURE:  Procedure(s): KYPHOPLASTY L1 (N/A)  SURGEON: Laurene Footman, MD  ASSISTANTS: None  ANESTHESIA:   local and MAC  EBL:  Total I/O In: 400 [I.V.:400] Out: 0   BLOOD ADMINISTERED:none  DRAINS: none   LOCAL MEDICATIONS USED:  MARCAINE    and LIDOCAINE   SPECIMEN:  Source of Specimen:  L1 vertebral body  DISPOSITION OF SPECIMEN:  PATHOLOGY  COUNTS:  YES  TOURNIQUET:  * No tourniquets in log *  IMPLANTS: Bone cement  DICTATION: .Dragon Dictation patient was brought the operating room and after adequate sedation was given patient was transferred to the operative table prone the C-arm was brought in and good visualization of both AP and lateral images were obtained, appropriate patient identification and timeout procedures were completed and then 10 cc of 1% Xylocaine was infiltrated subcutaneously. The back was then prepped and draped in sterile fashion and repeat timeout procedure carried out. Spinal needle was then used to get down to the pedicle and 10 cc 1% Xylocaine 10 cc half percent Sensorcaine with epinephrine was infiltrated along the path. After allowing this to set a small incision was made and a trocar advanced and extrapedicular fashion. The C-arm was used to help [place] to stay out of the spinal canal as well as neural foramen. After placing the trocar biopsy was obtained and drilling was carried out followed by placement of a balloon which helped elevated the superior endplate. Balloon was deflated when the cement was the appropriate consistency proximal before cc of bone cement was used to fill the body with very good fill across the midline and centrally after this had set the trochars removed and AP lateral images obtained. The wound was covered with Dermabond followed by a  Band-Aid  PLAN OF CARE: Discharge to home after PACU  PATIENT DISPOSITION:  PACU - hemodynamically stable.

## 2015-12-06 NOTE — Transfer of Care (Signed)
Immediate Anesthesia Transfer of Care Note  Patient: Royale L Bickle  Procedure(s) Performed: Procedure(s): KYPHOPLASTY L1 (N/A)  Patient Location: PACU  Anesthesia Type:General  Level of Consciousness: awake and alert   Airway & Oxygen Therapy: Patient Spontanous Breathing and Patient connected to nasal cannula oxygen  Post-op Assessment: Report given to RN and Post -op Vital signs reviewed and stable  Post vital signs: Reviewed and stable  Last Vitals:  Vitals:   12/06/15 1155 12/06/15 1319  BP: (!) 166/73 133/63  Pulse: 74 75  Resp: 14 17  Temp: 36.3 C 36.4 C    Last Pain:  Vitals:   12/06/15 1319  TempSrc: Tympanic         Complications: No apparent anesthesia complications

## 2015-12-07 LAB — SURGICAL PATHOLOGY

## 2016-09-12 ENCOUNTER — Other Ambulatory Visit: Payer: Self-pay | Admitting: Student

## 2016-09-12 DIAGNOSIS — Z96652 Presence of left artificial knee joint: Secondary | ICD-10-CM

## 2016-09-25 ENCOUNTER — Encounter
Admission: RE | Admit: 2016-09-25 | Discharge: 2016-09-25 | Disposition: A | Discharge status: Home / Self Care | Payer: Medicare Other | Source: Ambulatory Visit | Attending: Student | Admitting: Student

## 2016-09-25 DIAGNOSIS — Z96652 Presence of left artificial knee joint: Secondary | ICD-10-CM | POA: Insufficient documentation

## 2016-09-25 MED ORDER — TECHNETIUM TC 99M MEDRONATE IV KIT
25.0000 | PACK | Freq: Once | INTRAVENOUS | Status: AC | PRN
  Administered 2016-09-25: 24.68 via INTRAVENOUS

## 2017-02-11 ENCOUNTER — Other Ambulatory Visit: Payer: Self-pay | Admitting: Orthopedic Surgery

## 2017-02-11 DIAGNOSIS — S22080A Wedge compression fracture of T11-T12 vertebra, initial encounter for closed fracture: Secondary | ICD-10-CM

## 2017-02-18 ENCOUNTER — Ambulatory Visit
Admission: RE | Admit: 2017-02-18 | Discharge: 2017-02-18 | Disposition: A | Discharge status: Home / Self Care | Payer: Medicare Other | Source: Ambulatory Visit | Attending: Orthopedic Surgery | Admitting: Orthopedic Surgery

## 2017-02-18 DIAGNOSIS — S22080A Wedge compression fracture of T11-T12 vertebra, initial encounter for closed fracture: Secondary | ICD-10-CM | POA: Diagnosis present

## 2017-02-18 DIAGNOSIS — M4317 Spondylolisthesis, lumbosacral region: Secondary | ICD-10-CM | POA: Diagnosis not present

## 2017-02-18 DIAGNOSIS — X58XXXA Exposure to other specified factors, initial encounter: Secondary | ICD-10-CM | POA: Insufficient documentation

## 2017-02-18 DIAGNOSIS — M5136 Other intervertebral disc degeneration, lumbar region: Secondary | ICD-10-CM | POA: Insufficient documentation

## 2017-03-12 ENCOUNTER — Other Ambulatory Visit: Payer: Self-pay | Admitting: Internal Medicine

## 2017-03-12 DIAGNOSIS — R519 Headache, unspecified: Secondary | ICD-10-CM

## 2017-03-12 DIAGNOSIS — R51 Headache: Principal | ICD-10-CM

## 2017-03-25 ENCOUNTER — Ambulatory Visit
Admission: RE | Admit: 2017-03-25 | Discharge: 2017-03-25 | Disposition: A | Discharge status: Home / Self Care | Payer: Medicare Other | Source: Ambulatory Visit | Attending: Internal Medicine | Admitting: Internal Medicine

## 2017-03-25 DIAGNOSIS — G319 Degenerative disease of nervous system, unspecified: Secondary | ICD-10-CM | POA: Diagnosis not present

## 2017-03-25 DIAGNOSIS — R519 Headache, unspecified: Secondary | ICD-10-CM

## 2017-03-25 DIAGNOSIS — R51 Headache: Secondary | ICD-10-CM | POA: Insufficient documentation

## 2017-03-25 DIAGNOSIS — I6782 Cerebral ischemia: Secondary | ICD-10-CM | POA: Diagnosis not present

## 2017-06-03 ENCOUNTER — Emergency Department: Payer: Medicare Other

## 2017-06-03 ENCOUNTER — Inpatient Hospital Stay
Admission: EM | Admit: 2017-06-03 | Discharge: 2017-06-05 | DRG: 194 | Disposition: A | Discharge status: Home Health | Payer: Medicare Other | Attending: Family Medicine | Admitting: Family Medicine

## 2017-06-03 ENCOUNTER — Encounter: Payer: Self-pay | Admitting: Emergency Medicine

## 2017-06-03 ENCOUNTER — Other Ambulatory Visit: Payer: Self-pay

## 2017-06-03 DIAGNOSIS — Z6821 Body mass index (BMI) 21.0-21.9, adult: Secondary | ICD-10-CM

## 2017-06-03 DIAGNOSIS — R0902 Hypoxemia: Secondary | ICD-10-CM | POA: Diagnosis present

## 2017-06-03 DIAGNOSIS — F0281 Dementia in other diseases classified elsewhere with behavioral disturbance: Secondary | ICD-10-CM | POA: Diagnosis present

## 2017-06-03 DIAGNOSIS — Z961 Presence of intraocular lens: Secondary | ICD-10-CM | POA: Diagnosis present

## 2017-06-03 DIAGNOSIS — Z66 Do not resuscitate: Secondary | ICD-10-CM | POA: Diagnosis present

## 2017-06-03 DIAGNOSIS — Z9011 Acquired absence of right breast and nipple: Secondary | ICD-10-CM

## 2017-06-03 DIAGNOSIS — E785 Hyperlipidemia, unspecified: Secondary | ICD-10-CM | POA: Diagnosis present

## 2017-06-03 DIAGNOSIS — E039 Hypothyroidism, unspecified: Secondary | ICD-10-CM | POA: Diagnosis present

## 2017-06-03 DIAGNOSIS — Z853 Personal history of malignant neoplasm of breast: Secondary | ICD-10-CM

## 2017-06-03 DIAGNOSIS — K449 Diaphragmatic hernia without obstruction or gangrene: Secondary | ICD-10-CM | POA: Diagnosis present

## 2017-06-03 DIAGNOSIS — R531 Weakness: Secondary | ICD-10-CM

## 2017-06-03 DIAGNOSIS — H9190 Unspecified hearing loss, unspecified ear: Secondary | ICD-10-CM | POA: Diagnosis present

## 2017-06-03 DIAGNOSIS — M810 Age-related osteoporosis without current pathological fracture: Secondary | ICD-10-CM | POA: Diagnosis present

## 2017-06-03 DIAGNOSIS — K219 Gastro-esophageal reflux disease without esophagitis: Secondary | ICD-10-CM | POA: Diagnosis present

## 2017-06-03 DIAGNOSIS — D649 Anemia, unspecified: Secondary | ICD-10-CM | POA: Diagnosis present

## 2017-06-03 DIAGNOSIS — J111 Influenza due to unidentified influenza virus with other respiratory manifestations: Secondary | ICD-10-CM | POA: Diagnosis present

## 2017-06-03 DIAGNOSIS — Z85820 Personal history of malignant melanoma of skin: Secondary | ICD-10-CM

## 2017-06-03 DIAGNOSIS — Z9841 Cataract extraction status, right eye: Secondary | ICD-10-CM | POA: Diagnosis not present

## 2017-06-03 DIAGNOSIS — Z96652 Presence of left artificial knee joint: Secondary | ICD-10-CM | POA: Diagnosis present

## 2017-06-03 DIAGNOSIS — R7989 Other specified abnormal findings of blood chemistry: Secondary | ICD-10-CM

## 2017-06-03 DIAGNOSIS — G301 Alzheimer's disease with late onset: Secondary | ICD-10-CM | POA: Diagnosis present

## 2017-06-03 DIAGNOSIS — E876 Hypokalemia: Secondary | ICD-10-CM

## 2017-06-03 DIAGNOSIS — R778 Other specified abnormalities of plasma proteins: Secondary | ICD-10-CM

## 2017-06-03 DIAGNOSIS — I248 Other forms of acute ischemic heart disease: Secondary | ICD-10-CM | POA: Diagnosis present

## 2017-06-03 DIAGNOSIS — E7800 Pure hypercholesterolemia, unspecified: Secondary | ICD-10-CM | POA: Diagnosis present

## 2017-06-03 DIAGNOSIS — E440 Moderate protein-calorie malnutrition: Secondary | ICD-10-CM | POA: Diagnosis present

## 2017-06-03 DIAGNOSIS — Z881 Allergy status to other antibiotic agents status: Secondary | ICD-10-CM

## 2017-06-03 DIAGNOSIS — H3530 Unspecified macular degeneration: Secondary | ICD-10-CM | POA: Diagnosis present

## 2017-06-03 DIAGNOSIS — J101 Influenza due to other identified influenza virus with other respiratory manifestations: Secondary | ICD-10-CM | POA: Diagnosis present

## 2017-06-03 DIAGNOSIS — Z9842 Cataract extraction status, left eye: Secondary | ICD-10-CM

## 2017-06-03 DIAGNOSIS — I10 Essential (primary) hypertension: Secondary | ICD-10-CM | POA: Diagnosis present

## 2017-06-03 HISTORY — DX: Osteitis deformans of unspecified bone: M88.9

## 2017-06-03 LAB — CBC WITH DIFFERENTIAL/PLATELET
BASOS ABS: 0 10*3/uL (ref 0–0.1)
Basophils Relative: 0 %
EOS ABS: 0 10*3/uL (ref 0–0.7)
EOS PCT: 0 %
HCT: 41.5 % (ref 35.0–47.0)
Hemoglobin: 14 g/dL (ref 12.0–16.0)
Lymphocytes Relative: 13 %
Lymphs Abs: 0.7 10*3/uL — ABNORMAL LOW (ref 1.0–3.6)
MCH: 32.2 pg (ref 26.0–34.0)
MCHC: 33.8 g/dL (ref 32.0–36.0)
MCV: 95.4 fL (ref 80.0–100.0)
Monocytes Absolute: 0.8 10*3/uL (ref 0.2–0.9)
Monocytes Relative: 15 %
Neutro Abs: 3.6 10*3/uL (ref 1.4–6.5)
Neutrophils Relative %: 72 %
PLATELETS: 172 10*3/uL (ref 150–440)
RBC: 4.36 MIL/uL (ref 3.80–5.20)
RDW: 13.7 % (ref 11.5–14.5)
WBC: 5 10*3/uL (ref 3.6–11.0)

## 2017-06-03 LAB — COMPREHENSIVE METABOLIC PANEL
ALK PHOS: 59 U/L (ref 38–126)
ALT: 10 U/L — AB (ref 14–54)
AST: 24 U/L (ref 15–41)
Albumin: 3.3 g/dL — ABNORMAL LOW (ref 3.5–5.0)
Anion gap: 7 (ref 5–15)
BUN: 13 mg/dL (ref 6–20)
CALCIUM: 8.6 mg/dL — AB (ref 8.9–10.3)
CO2: 33 mmol/L — ABNORMAL HIGH (ref 22–32)
CREATININE: 0.93 mg/dL (ref 0.44–1.00)
Chloride: 101 mmol/L (ref 101–111)
GFR, EST AFRICAN AMERICAN: 60 mL/min — AB (ref >60)
GFR, EST NON AFRICAN AMERICAN: 51 mL/min — AB (ref >60)
Glucose, Bld: 93 mg/dL (ref 65–99)
Potassium: 2.6 mmol/L — CL (ref 3.5–5.1)
Sodium: 141 mmol/L (ref 135–145)
Total Bilirubin: 0.7 mg/dL (ref 0.3–1.2)
Total Protein: 6.6 g/dL (ref 6.5–8.1)

## 2017-06-03 LAB — URINALYSIS, COMPLETE (UACMP) WITH MICROSCOPIC
BILIRUBIN URINE: NEGATIVE
Glucose, UA: NEGATIVE mg/dL
HGB URINE DIPSTICK: NEGATIVE
KETONES UR: 5 mg/dL — AB
Nitrite: NEGATIVE
PROTEIN: 100 mg/dL — AB
Specific Gravity, Urine: 1.016 (ref 1.005–1.030)
pH: 6 (ref 5.0–8.0)

## 2017-06-03 LAB — LACTIC ACID, PLASMA
Lactic Acid, Venous: 1.1 mmol/L (ref 0.5–1.9)
Lactic Acid, Venous: 1.7 mmol/L (ref 0.5–1.9)

## 2017-06-03 LAB — TROPONIN I
TROPONIN I: 0.04 ng/mL — AB (ref <0.03)
Troponin I: 0.05 ng/mL (ref <0.03)
Troponin I: 0.05 ng/mL (ref <0.03)

## 2017-06-03 LAB — POTASSIUM: POTASSIUM: 3.6 mmol/L (ref 3.5–5.1)

## 2017-06-03 LAB — PROCALCITONIN: Procalcitonin: <0.1 ng/mL

## 2017-06-03 LAB — INFLUENZA PANEL BY PCR (TYPE A & B)
INFLAPCR: POSITIVE — AB
Influenza B By PCR: NEGATIVE

## 2017-06-03 MED ORDER — OSELTAMIVIR PHOSPHATE 75 MG PO CAPS: 75.0000 mg | ORAL_CAPSULE | Freq: Two times a day (BID) | ORAL | Status: DC

## 2017-06-03 MED ORDER — OSELTAMIVIR PHOSPHATE 30 MG PO CAPS
30.0000 mg | ORAL_CAPSULE | Freq: Two times a day (BID) | ORAL | Status: DC
  Administered 2017-06-03: 30 mg via ORAL
  Filled 2017-06-03 (×3): qty 1

## 2017-06-03 MED ORDER — AMLODIPINE BESYLATE 5 MG PO TABS
5.0000 mg | ORAL_TABLET | Freq: Every day | ORAL | Status: DC
  Administered 2017-06-04 – 2017-06-05 (×2): 5 mg via ORAL
  Filled 2017-06-03 (×2): qty 1

## 2017-06-03 MED ORDER — IPRATROPIUM-ALBUTEROL 0.5-2.5 (3) MG/3ML IN SOLN
3.0000 mL | Freq: Once | RESPIRATORY_TRACT | Status: AC
  Administered 2017-06-03: 3 mL via RESPIRATORY_TRACT
  Filled 2017-06-03: qty 3

## 2017-06-03 MED ORDER — ENOXAPARIN SODIUM 30 MG/0.3ML ~~LOC~~ SOLN
30.0000 mg | SUBCUTANEOUS | Status: DC
  Administered 2017-06-03: 30 mg via SUBCUTANEOUS
  Filled 2017-06-03: qty 0.3

## 2017-06-03 MED ORDER — CARBIDOPA-LEVODOPA ER 25-100 MG PO TBCR
1.5000 | EXTENDED_RELEASE_TABLET | Freq: Three times a day (TID) | ORAL | Status: DC
  Administered 2017-06-03 – 2017-06-05 (×5): 1.5 via ORAL
  Filled 2017-06-03 (×8): qty 1.5

## 2017-06-03 MED ORDER — KCL IN DEXTROSE-NACL 40-5-0.45 MEQ/L-%-% IV SOLN
INTRAVENOUS | Status: DC
  Administered 2017-06-03: 11:00:00 via INTRAVENOUS
  Filled 2017-06-03 (×7): qty 1000

## 2017-06-03 MED ORDER — POTASSIUM CHLORIDE 20 MEQ PO PACK: 40.0000 meq | PACK | Freq: Four times a day (QID) | ORAL | Status: DC

## 2017-06-03 MED ORDER — ASPIRIN EC 81 MG PO TBEC
81.0000 mg | DELAYED_RELEASE_TABLET | Freq: Every day | ORAL | Status: DC
  Administered 2017-06-04 – 2017-06-05 (×2): 81 mg via ORAL
  Filled 2017-06-03 (×2): qty 1

## 2017-06-03 MED ORDER — ONDANSETRON HCL 4 MG/2ML IJ SOLN: 4.0000 mg | Freq: Four times a day (QID) | INTRAMUSCULAR | Status: DC | PRN

## 2017-06-03 MED ORDER — POTASSIUM CHLORIDE 20 MEQ PO PACK
40.0000 meq | PACK | Freq: Four times a day (QID) | ORAL | Status: AC
  Administered 2017-06-03: 40 meq via ORAL
  Filled 2017-06-03: qty 2

## 2017-06-03 MED ORDER — VANCOMYCIN HCL IN DEXTROSE 750-5 MG/150ML-% IV SOLN: 750.0000 mg | INTRAVENOUS | Status: DC

## 2017-06-03 MED ORDER — ACETAMINOPHEN 500 MG PO TABS
500.0000 mg | ORAL_TABLET | Freq: Four times a day (QID) | ORAL | Status: DC | PRN
Start: 2017-06-03 — End: 2017-06-05

## 2017-06-03 MED ORDER — PIPERACILLIN-TAZOBACTAM 3.375 G IVPB: 3.3750 g | Freq: Three times a day (TID) | INTRAVENOUS | Status: DC

## 2017-06-03 MED ORDER — MELATONIN 5 MG PO TABS
5.0000 mg | ORAL_TABLET | Freq: Every day | ORAL | Status: DC
  Administered 2017-06-03 – 2017-06-04 (×2): 5 mg via ORAL
  Filled 2017-06-03 (×3): qty 1

## 2017-06-03 MED ORDER — DONEPEZIL HCL 5 MG PO TABS
10.0000 mg | ORAL_TABLET | Freq: Every day | ORAL | Status: DC
  Administered 2017-06-03 – 2017-06-04 (×2): 10 mg via ORAL
  Filled 2017-06-03: qty 1
  Filled 2017-06-03 (×2): qty 2

## 2017-06-03 MED ORDER — PIPERACILLIN-TAZOBACTAM 3.375 G IVPB 30 MIN
3.3750 g | Freq: Once | INTRAVENOUS | Status: AC
  Administered 2017-06-03: 3.375 g via INTRAVENOUS
  Filled 2017-06-03: qty 50

## 2017-06-03 MED ORDER — OSELTAMIVIR PHOSPHATE 30 MG PO CAPS
30.0000 mg | ORAL_CAPSULE | Freq: Every day | ORAL | Status: DC
  Administered 2017-06-04: 30 mg via ORAL
  Filled 2017-06-03: qty 1

## 2017-06-03 MED ORDER — SODIUM CHLORIDE 0.9 % IV SOLN
Freq: Once | INTRAVENOUS | Status: AC
  Administered 2017-06-03: 10:00:00 via INTRAVENOUS

## 2017-06-03 MED ORDER — ONDANSETRON HCL 4 MG PO TABS: 4.0000 mg | ORAL_TABLET | Freq: Four times a day (QID) | ORAL | Status: DC | PRN

## 2017-06-03 MED ORDER — PANTOPRAZOLE SODIUM 40 MG PO TBEC
40.0000 mg | DELAYED_RELEASE_TABLET | Freq: Every day | ORAL | Status: DC
  Administered 2017-06-05: 40 mg via ORAL
  Filled 2017-06-03: qty 1

## 2017-06-03 MED ORDER — POTASSIUM CHLORIDE 20 MEQ PO PACK
40.0000 meq | PACK | Freq: Two times a day (BID) | ORAL | Status: DC
  Administered 2017-06-03: 40 meq via ORAL
  Filled 2017-06-03: qty 2

## 2017-06-03 MED ORDER — PRAVASTATIN SODIUM 20 MG PO TABS
80.0000 mg | ORAL_TABLET | Freq: Every day | ORAL | Status: DC
  Administered 2017-06-03 – 2017-06-04 (×2): 80 mg via ORAL
  Filled 2017-06-03 (×2): qty 4

## 2017-06-03 MED ORDER — OSELTAMIVIR PHOSPHATE 75 MG PO CAPS
ORAL_CAPSULE | ORAL | Status: AC
  Filled 2017-06-03: qty 1

## 2017-06-03 MED ORDER — VANCOMYCIN HCL IN DEXTROSE 1-5 GM/200ML-% IV SOLN
1000.0000 mg | Freq: Once | INTRAVENOUS | Status: AC
  Administered 2017-06-03: 1000 mg via INTRAVENOUS
  Filled 2017-06-03: qty 200

## 2017-06-03 NOTE — ED Provider Notes (Addendum)
[facility] Emergency Department Provider Note       Time seen: ----------------------------------------- 9:37 AM on 06/03/2017 ----------------------------------------- Level V caveat: History/ROS limited by altered mental status, dementia  I have reviewed the triage vital signs and the nursing notes.  HISTORY   Chief Complaint Weakness    HPI Darlene Paul is a 84 y.o. female with a history of anemia, breast cancer, GERD, dementia, hyperlipidemia, hypertension who presents to the ED for weakness.  Patient comes from home where she has had increased weakness and cough for the past 2 weeks.  She was given a prescription for antibiotic 2 weeks ago by her primary care doctor but family did not feel comfortable giving this to the patient because of the potential side effects.  Patient was hypoxic on room air at 87%.  She was placed on nasal cannula oxygen.  She was given some Zofran IV fluids in route.  Family reports increased weakness with difficulty walking, no history of oxygen requirement.  Past Medical History:  Diagnosis Date  . Alopecia of scalp   . Anemia   . Arthritis   . Cancer ([place]) 1992   right breast  . Complication of anesthesia    rash on left arm after surgery  . Dementia   . GERD (gastroesophageal reflux disease)   . H/O hiatal hernia   . Hard of hearing   . Hypercholesterolemia   . Hypertension   . Hypothyroidism    "nodules x 3"- s/p Radioactive Iodine Therapy  . Macular degeneration of both eyes    injection  right eye 05/31/12  . Melanoma of forehead ([place])    Removed previously  . Nervous   . Osteoporosis     Patient Active Problem List   Diagnosis Date Noted  . BRBPR (bright red blood per rectum) 11/01/2015  . Alzheimer's disease 11/01/2015  . Late onset Alzheimer's disease with behavioral disturbance     Past Surgical History:  Procedure Laterality Date  . APPENDECTOMY    . BREAST SURGERY     right mastectomy with  node dissection  . CATARACT EXTRACTION W/ INTRAOCULAR LENS  IMPLANT, BILATERAL    . CYSTOGRAM N/A 06/14/2012   Procedure: CYSTOGRAM;  Surgeon: Molli Hazard, MD;  Location: [facility] ORS;  Service: Urology;  Laterality: N/A;  . CYSTOSCOPY W/ RETROGRADES N/A 06/14/2012   Procedure: CYSTOSCOPY WITH RETROGRADE PYELOGRAM;  Surgeon: Molli Hazard, MD;  Location: [facility] ORS;  Service: Urology;  Laterality: N/A;  . CYSTOSCOPY WITH BIOPSY N/A 06/14/2012   Procedure: CYSTOSCOPY WITH BIOPSY;  Surgeon: Molli Hazard, MD;  Location: [facility] ORS;  Service: Urology;  Laterality: N/A;  . EYE SURGERY Bilateral    cataract extraction with IOL  . JOINT REPLACEMENT Left    knee  . KYPHOPLASTY N/A 12/06/2015   Procedure: KYPHOPLASTY L1;  Surgeon: Hessie Knows, MD;  Location: [facility] ORS;  Service: Orthopedics;  Laterality: N/A;  . OVARIAN CYST REMOVAL      Allergies Azithromycin and Sulfa antibiotics  Social History Social History   Tobacco Use  . Smoking status: Never Smoker  . Smokeless tobacco: Never Used  Substance Use Topics  . Alcohol use: No  . Drug use: No   Review of Systems Positive for weakness, cough, hypoxia  All systems negative or are otherwise unknown at this point.  ____________________________________________   PHYSICAL EXAM:  VITAL SIGNS: ED Triage Vitals  Enc Vitals Group     BP      Pulse  Resp      Temp      Temp src      SpO2      Weight      Height      Head Circumference      Peak Flow      Pain Score      Pain Loc      Pain Edu?      Excl. in [place]?    Constitutional: Allergic, mild distress Eyes: Conjunctivae are normal. Normal extraocular movements. ENT   Head: Normocephalic and atraumatic.   Nose: No congestion/rhinnorhea.   Mouth/Throat: Mucous membranes are moist.   Neck: No stridor. Cardiovascular: Normal rate, regular rhythm. No murmurs, rubs, or gallops. Respiratory: Normal respiratory effort without tachypnea nor  retractions.  Scattered rhonchi bilaterally Gastrointestinal: Soft and nontender. Normal bowel sounds Musculoskeletal: Nontender with normal range of motion in extremities. No lower extremity tenderness nor edema. Neurologic:  No gross focal neurologic deficits are appreciated.  Generalized weakness Skin:  Skin is warm, dry and intact. No rash noted. Psychiatric: Mood and affect are normal.  ____________________________________________  EKG: Interpreted by me.  Sinus rhythm with sinus arrhythmia, rate of 75 bpm, incomplete right bundle branch block, left anterior fascicular block, nonspecific T wave changes, borderline long QT  ____________________________________________  ED COURSE:  As part of my medical decision making, I reviewed the following data within the [place] History obtained from family if available, nursing notes, old chart and ekg, as well as notes from prior ED visits. Patient presented for weakness and cough with likely pneumonia, we will assess with labs and imaging as indicated at this time.   Procedures ____________________________________________   LABS (pertinent positives/negatives)  Labs Reviewed  COMPREHENSIVE METABOLIC PANEL - Abnormal; Notable for the following components:      Result Value   Potassium 2.6 (*)    CO2 33 (*)    Calcium 8.6 (*)    Albumin 3.3 (*)    ALT 10 (*)    GFR calc non Af Amer 51 (*)    GFR calc Af Amer 60 (*)    All other components within normal limits  TROPONIN I - Abnormal; Notable for the following components:   Troponin I 0.04 (*)    All other components within normal limits  CBC WITH DIFFERENTIAL/PLATELET - Abnormal; Notable for the following components:   Lymphs Abs 0.7 (*)    All other components within normal limits  BLOOD GAS, VENOUS - Abnormal; Notable for the following components:   pH, Ven 7.44 (*)    Bicarbonate 31.2 (*)    Acid-Base Excess 6.0 (*)    All other components within normal  limits  URINALYSIS, COMPLETE (UACMP) WITH MICROSCOPIC - Abnormal; Notable for the following components:   Color, Urine AMBER (*)    APPearance CLOUDY (*)    Ketones, ur 5 (*)    Protein, ur 100 (*)    Leukocytes, UA TRACE (*)    All other components within normal limits  CULTURE, BLOOD (ROUTINE X 2)  CULTURE, BLOOD (ROUTINE X 2)  URINE CULTURE  LACTIC ACID, PLASMA  LACTIC ACID, PLASMA    RADIOLOGY Chest x-ray Did not reveal any pneumonia IMPRESSION: Mild bibasilar scarring. No edema or consolidation. Stable cardiac silhouette. There is aortic atherosclerosis. Bones are osteoporotic.  Aortic Atherosclerosis (ICD10-I70.0). ____________________________________________ CRITICAL CARE Performed by: Laurence Aly   Total critical care time: 30 minutes  Critical care time was exclusive of separately billable procedures  and treating other patients.  Critical care was necessary to treat or prevent imminent or life-threatening deterioration.  Critical care was time spent personally by me on the following activities: development of treatment plan with patient and/or surrogate as well as nursing, discussions with consultants, evaluation of patient's response to treatment, examination of patient, obtaining history from patient or surrogate, ordering and performing treatments and interventions, ordering and review of laboratory studies, ordering and review of radiographic studies, pulse oximetry and re-evaluation of patient's condition.   DIFFERENTIAL DIAGNOSIS   Pneumonia, sepsis, URI, dehydration, electrolyte abnormality  FINAL ASSESSMENT AND PLAN  Weakness, cough, hypoxia, hypokalemia, elevated troponin, influenza A   Plan: The patient had presented for weakness and cough with hypoxia. Patient's labs actually look better than expected.  Troponin was elevated slightly, potassium was low at 2.6.  She was started on IV fluids with potassium.  Patient's imaging did not reveal  any acute process.  She is still requiring supplemental oxygen. We did presumptively begin treating for pneumonia with vancomycin and Zosyn, but her flu test came back positive.  I am doubtful that Tamiflu would help her at this point.  She is stable for admission at this time.   Laurence Aly, MD   Note: This note was generated in part or whole with voice recognition software. Voice recognition is usually quite accurate but there are transcription errors that can and very often do occur. I apologize for any typographical errors that were not detected and corrected.     Earleen Newport, MD 06/03/17 1119    Earleen Newport, MD 06/03/17 1236    Earleen Newport, MD 06/18/17 [phone]

## 2017-06-03 NOTE — Progress Notes (Signed)
Pharmacy Antibiotic Note  Darlene Paul is a 84 y.o. female admitted on 06/03/2017 with sepsis.  Pharmacy has been consulted for vancomycin and Zosyn dosing.  Received once dose of vancomycin and Zosyn in ED   Plan: Zosyn 3.375g IV q8h (CrCl 29.32ml/min)   Vancomycin 750 mg IV q36h with 19 hour stack dose. Estimated Cmin=15. Will check first vancomycin trough level prior to fourth dose. Will continue to monitor patients renal function and adjust doses as needed.   Ke 0.029, t1/2 23.9, Vd 35.1   Height: 5\' 2"  (157.5 cm) Weight: 122 lb (55.3 kg) IBW/kg (Calculated) : 50.1  Temp (24hrs), Avg:98.7 F (37.1 C), Min:98.7 F (37.1 C), Max:98.7 F (37.1 C)  Recent Labs  Lab 06/03/17 0949  WBC 5.0  CREATININE 0.93  LATICACIDVEN 1.1    Estimated Creatinine Clearance: 29.9 mL/min (by C-G formula based on SCr of 0.93 mg/dL).    Allergies  Allergen Reactions  . Prednisolone     Rash   . Azithromycin Palpitations  . Sulfa Antibiotics Rash    Antimicrobials this admission: 4/10 Vancomycin >>  4/10 Zosyn >>   Dose adjustments this admission:   Microbiology results: 4/10 BCx: sent 4/10 UCx: sent   Thank you for allowing pharmacy to be a part of this patient's care.  Candelaria Stagers, PharmD Pharmacy Resident  06/03/2017 11:50 AM

## 2017-06-03 NOTE — ED Triage Notes (Addendum)
Pt to ED via EMS from home with c/o increased weakness and cough x2wks. Pt was given rx for antibiotic x2 wks ago from PCP but has not been given to pt. Pt was 87% on RA, 4L [place] given. PT given 556mL of fluid and 4mg  of zofran in route. Per family decreased PO intake xfew days . PT has productive cough noted. MD at bedside. Hx of dementia

## 2017-06-03 NOTE — H&P (Signed)
[facility] [place] NAME: Darlene Paul    MR#:  [id number]  DATE OF BIRTH:  [birth year]  DATE OF ADMISSION:  06/03/2017  PRIMARY CARE PHYSICIAN: Idelle Crouch, MD   REQUESTING/REFERRING PHYSICIAN: Dr. Lenise Arena  CHIEF COMPLAINT:   Chief Complaint  Patient presents with  . Weakness    HISTORY OF PRESENT ILLNESS:  Darlene Paul  is a 84 y.o. female with a known history of arthritis, dementia, hard of hearing, macular degeneration, hypothyroidism, hypertension presents to hospital secondary to worsening weakness, cough and shortness of breath. Due to her dementia, most of her history is obtained from family at bedside.  At baseline patient is able to ambulate with a cane, interact with family members.  Symptoms started about a week ago, her daughter was sick with upper respiratory infection.  Patient started to have some cough shortness of breath, malaise and weakness.  In the last day she has not been able to ambulate at all and family had to lift her to get to the bed.  Her influenza test is positive in the emergency room.  Chest x-ray otherwise negative.  Labs showing hypokalemia.  PAST MEDICAL HISTORY:   Past Medical History:  Diagnosis Date  . Alopecia of scalp   . Anemia   . Arthritis   . Cancer ([place]) 1992   right breast  . Complication of anesthesia    rash on left arm after surgery  . Dementia   . GERD (gastroesophageal reflux disease)   . H/O hiatal hernia   . Hard of hearing   . Hypercholesterolemia   . Hypertension   . Hypothyroidism    "nodules x 3"- s/p Radioactive Iodine Therapy  . Macular degeneration of both eyes    injection  right eye 05/31/12  . Melanoma of forehead ([place])    Removed previously  . Nervous   . Osteoporosis   . Paget disease of bone     PAST SURGICAL HISTORY:   Past Surgical History:  Procedure Laterality Date  . APPENDECTOMY    . BREAST SURGERY     right mastectomy with node  dissection  . CATARACT EXTRACTION W/ INTRAOCULAR LENS  IMPLANT, BILATERAL    . CYSTOGRAM N/A 06/14/2012   Procedure: CYSTOGRAM;  Surgeon: Molli Hazard, MD;  Location: [facility];  Service: Urology;  Laterality: N/A;  . CYSTOSCOPY W/ RETROGRADES N/A 06/14/2012   Procedure: CYSTOSCOPY WITH RETROGRADE PYELOGRAM;  Surgeon: Molli Hazard, MD;  Location: [facility];  Service: Urology;  Laterality: N/A;  . CYSTOSCOPY WITH BIOPSY N/A 06/14/2012   Procedure: CYSTOSCOPY WITH BIOPSY;  Surgeon: Molli Hazard, MD;  Location: [facility];  Service: Urology;  Laterality: N/A;  . EYE SURGERY Bilateral    cataract extraction with IOL  . JOINT REPLACEMENT Left    knee  . KYPHOPLASTY N/A 12/06/2015   Procedure: KYPHOPLASTY L1;  Surgeon: Hessie Knows, MD;  Location: [facility] ORS;  Service: Orthopedics;  Laterality: N/A;  . OVARIAN CYST REMOVAL      SOCIAL HISTORY:   Social History   Tobacco Use  . Smoking status: Never Smoker  . Smokeless tobacco: Never Used  Substance Use Topics  . Alcohol use: No    FAMILY HISTORY:   Family History  Family history unknown: Yes    DRUG ALLERGIES:   Allergies  Allergen Reactions  . Prednisolone     Rash   . Azithromycin Palpitations  . Sulfa Antibiotics Rash  REVIEW OF SYSTEMS:   Review of Systems  Constitutional: Positive for malaise/fatigue. Negative for chills and fever.  HENT: Positive for hearing loss. Negative for congestion, ear discharge and nosebleeds.   Eyes: Positive for blurred vision.  Respiratory: Positive for cough and shortness of breath. Negative for wheezing.   Cardiovascular: Negative for chest pain and palpitations.  Gastrointestinal: Negative for abdominal pain, constipation, diarrhea, nausea and vomiting.  Genitourinary: Negative for dysuria.  Musculoskeletal: Negative for myalgias.  Neurological: Negative for dizziness, seizures and headaches.    MEDICATIONS AT HOME:   Prior to Admission medications   Medication  Sig Start Date End Date Taking? Authorizing Provider  acetaminophen (TYLENOL) 500 MG tablet Take 500 mg by mouth every 6 (six) hours as needed for moderate pain or headache.   Yes [provider]  amLODipine (NORVASC) 5 MG tablet Take 5 mg by mouth daily. 10/29/15  Yes [provider]  aspirin 81 MG tablet Take 81 mg by mouth daily.   Yes [provider]  Carbidopa-Levodopa ER (SINEMET CR) 25-100 MG tablet controlled release Take 1.5 tablets by mouth 3 (three) times daily.   Yes [provider]  donepezil (ARICEPT) 10 MG tablet Take 10 mg by mouth daily. 10/08/15  Yes [provider]  Melatonin 3 MG TABS Take 6 mg by mouth at bedtime.   Yes [provider]  omeprazole (PRILOSEC) 20 MG capsule Take 20 mg by mouth 2 (two) times daily.    Yes [provider]  pravastatin (PRAVACHOL) 80 MG tablet Take 80 mg by mouth at bedtime.    Yes [provider]      VITAL SIGNS:  Blood pressure (!) 154/87, pulse 67, temperature 98.7 F (37.1 C), temperature source Oral, resp. rate 16, height 5\' 2"  (1.575 m), weight 55.3 kg (122 lb), SpO2 97 %.  PHYSICAL EXAMINATION:   Physical Exam  GENERAL:  84 y.o.-year-old elderly patient lying in the bed with no acute distress.  EYES: Pupils equal, round, reactive to light and accommodation. No scleral icterus. Extraocular muscles intact.  HEENT: Head atraumatic, normocephalic. Oropharynx and nasopharynx clear.  NECK:  Supple, no jugular venous distention. No thyroid enlargement, no tenderness.  LUNGS: Normal breath sounds bilaterally, no wheezing, rales  or crepitation. No use of accessory muscles of respiration. Bibasilar rhonchi CARDIOVASCULAR: S1, S2 normal. No   rubs, or gallops. 2/6 systolic murmur present ABDOMEN: Soft, nontender, nondistended. Bowel sounds present. No organomegaly or mass.  EXTREMITIES: No pedal edema, cyanosis, or clubbing.  NEUROLOGIC: Cranial nerves II through XII are  intact. Muscle strength 4/5 in all extremities except 3/5 in LLE. Sensation intact. Gait not checked. Global weakness noted. PSYCHIATRIC: The patient is alert and oriented to self.  SKIN: No obvious rash, lesion, or ulcer.   LABORATORY PANEL:   CBC Recent Labs  Lab 06/03/17 0949  WBC 5.0  HGB 14.0  HCT 41.5  PLT 172   ------------------------------------------------------------------------------------------------------------------  Chemistries  Recent Labs  Lab 06/03/17 0949  NA 141  K 2.6*  CL 101  CO2 33*  GLUCOSE 93  BUN 13  CREATININE 0.93  CALCIUM 8.6*  AST 24  ALT 10*  ALKPHOS 59  BILITOT 0.7   ------------------------------------------------------------------------------------------------------------------  Cardiac Enzymes Recent Labs  Lab 06/03/17 0949  TROPONINI 0.04*   ------------------------------------------------------------------------------------------------------------------  RADIOLOGY:  Ct Head Wo Contrast  Result Date: 06/03/2017 CLINICAL DATA:  Weakness, altered level of consciousness EXAM: CT HEAD WITHOUT CONTRAST TECHNIQUE: Contiguous axial images were obtained from the base of  the skull through the vertex without intravenous contrast. COMPARISON:  03/25/2017 FINDINGS: Brain: There is atrophy and chronic small vessel disease changes. Chronic calcification in the left basal ganglia, unchanged. No acute intracranial abnormality. Specifically, no hemorrhage, hydrocephalus, mass lesion, acute infarction, or significant intracranial injury. Vascular: No hyperdense vessel or unexpected calcification. Skull: No acute calvarial abnormality. Postop craniectomy changes in the right frontal bone, unchanged. Sinuses/Orbits: Mucosal thickening in the paranasal sinuses. Mastoid air cells are clear. Orbital soft tissues unremarkable. Other: None IMPRESSION: No acute intracranial abnormality. Atrophy, chronic microvascular disease. Electronically Signed   By:  Rolm Baptise M.D.   On: 06/03/2017 11:19   Dg Chest Port 1 View  Result Date: 06/03/2017 CLINICAL DATA:  Cough.  Hypertension. EXAM: PORTABLE CHEST 1 VIEW COMPARISON:  October 07, 2015 FINDINGS: There is mild scarring in the lower lobe regions. There is no edema or consolidation. The heart size and pulmonary vascularity are normal. No adenopathy. There is aortic atherosclerosis. Bones are osteoporotic. IMPRESSION: Mild bibasilar scarring. No edema or consolidation. Stable cardiac silhouette. There is aortic atherosclerosis. Bones are osteoporotic. Aortic Atherosclerosis (ICD10-I70.0). Electronically Signed   By: Lowella Grip III M.D.   On: 06/03/2017 10:34    EKG:   Orders placed or performed during the hospital encounter of 06/03/17  . ED EKG 12-Lead  . ED EKG 12-Lead  . EKG 12-Lead  . EKG 12-Lead  . EKG 12-Lead  . EKG 12-Lead    IMPRESSION AND PLAN:   Darlene Paul  is a 84 y.o. female with a known history of arthritis, dementia, hard of hearing, macular degeneration, hypothyroidism, hypertension presents to hospital secondary to worsening weakness, cough and shortness of breath.  1.  Influenza illness-exposed to daughter who was sick recently. -Droplet precautions.  Wean oxygen as tolerated, currently requiring 1-2 L O2 -On Tamiflu  2.  Weakness-secondary to hypokalemia.  And also influenza illness. -Replace potassium and recheck in a.m.  3.  Elevated troponin-likely demand ischemia.  Patient denies any cardiac symptoms  4.  Dementia and parkinsonian symptoms-continue Sinemet and Aricept  5.  GERD-on Protonix now  6.  DVT prophylaxis-on Lovenox  Physical therapy consulted    All the records are reviewed and case discussed with ED provider. Management plans discussed with the patient, family and they are in agreement.  CODE STATUS: Full Code  TOTAL TIME TAKING CARE OF THIS PATIENT: 50 minutes.    Gladstone Lighter M.D on 06/03/2017 at 1:29 PM  Between 7am to  6pm - Pager - [phone]  After 6pm go to [email or web address] - password [email or web address] [facility]  Office  [phone]  CC: Primary care physician; Idelle Crouch, MD

## 2017-06-03 NOTE — Progress Notes (Signed)
   [facility] - [place] [facility] Day: 0 days Darlene Paul is a 84 y.o. female presenting with Weakness .   Advance care planning discussed with patient, her family and grand daughter (who is a Therapist, sports) at bedside. All questions in regards to overall condition and expected prognosis answered. The decision was made to continue current code status  CODE STATUS: DNR Time spent: 18 minutes

## 2017-06-03 NOTE — Progress Notes (Signed)
Pharmacist - Prescriber Communication  Enoxaparin dose modified from 40 mg subcutaneously once daily to 30 mg subcutaneously once daily due to creatinine clearance less than 30 mL/min.  Vernard Gram A. [place], [place].D., BCPS Clinical Pharmacist 06/03/2017 17:46

## 2017-06-03 NOTE — Progress Notes (Signed)
Pharmacy Electrolyte Monitoring Consult:  Pharmacy consulted to assist in monitoring and replacing electrolytes in this 84 y.o. female admitted on 06/03/2017 with Weakness   Labs:  Sodium (mmol/L)  Date Value  06/03/2017 141   Potassium (mmol/L)  Date Value  06/03/2017 2.6 (LL)   Calcium (mg/dL)  Date Value  06/03/2017 8.6 (L)   Albumin (g/dL)  Date Value  06/03/2017 3.3 (L)    Plan: Patient currently receiving D5W+1/2NS+KCl 78mEq/L @ 287ml/hr (=66mEq/hr) Will additionally give KCl 49mEq PO x2 doses.   Pharmacy to continue to follow and replace electrolytes per protocol.   Candelaria Stagers, PharmD Pharmacy Resident  06/03/2017 12:31 PM

## 2017-06-03 NOTE — Progress Notes (Signed)
Pharmacy Electrolyte Monitoring Consult:  Pharmacy consulted to assist in monitoring and replacing electrolytes in this 84 y.o. female admitted on 06/03/2017 with Weakness   Labs:  Sodium (mmol/L)  Date Value  06/03/2017 141   Potassium (mmol/L)  Date Value  06/03/2017 3.6   Calcium (mg/dL)  Date Value  06/03/2017 8.6 (L)   Albumin (g/dL)  Date Value  06/03/2017 3.3 (L)    Plan: K = 3.6 is WNL. No additional supplementation needed at this time.   Pharmacy to continue to follow and replace electrolytes per protocol.   Lenis Noon, PharmD, BCPS Clinical Pharmacist 06/03/2017 9:22 PM

## 2017-06-03 NOTE — Progress Notes (Signed)
MEDICATION RELATED CONSULT NOTE - INITIAL   Pharmacy Consult for oseltamivir (Tamiflu)  Indication: Influenza A positive   Labs: Recent Labs    06/03/17 0949  WBC 5.0  HGB 14.0  HCT 41.5  PLT 172  CREATININE 0.93  ALBUMIN 3.3*  PROT 6.6  AST 24  ALT 10*  ALKPHOS 59  BILITOT 0.7   Estimated Creatinine Clearance: 29.9 mL/min (by C-G formula based on SCr of 0.93 mg/dL).   Plan:  Will start treatment dose of Tamiflu 30mg  PO once daily. CrCl 29.19ml/min is on boarder of dosing once daily versus twice daily. Will continue to follow renal function and dose Tamiflu per protocol.   Candelaria Stagers, PharmD Pharmacy Resident  06/03/2017,3:59 PM

## 2017-06-04 DIAGNOSIS — E440 Moderate protein-calorie malnutrition: Secondary | ICD-10-CM

## 2017-06-04 LAB — BASIC METABOLIC PANEL
Anion gap: 3 — ABNORMAL LOW (ref 5–15)
BUN: 9 mg/dL (ref 6–20)
CO2: 28 mmol/L (ref 22–32)
Calcium: 8.3 mg/dL — ABNORMAL LOW (ref 8.9–10.3)
Chloride: 109 mmol/L (ref 101–111)
Creatinine, Ser: 0.6 mg/dL (ref 0.44–1.00)
GFR calc Af Amer: >60 mL/min (ref >60)
GLUCOSE: 77 mg/dL (ref 65–99)
POTASSIUM: 4 mmol/L (ref 3.5–5.1)
Sodium: 140 mmol/L (ref 135–145)

## 2017-06-04 LAB — CBC
HEMATOCRIT: 38.5 % (ref 35.0–47.0)
Hemoglobin: 13 g/dL (ref 12.0–16.0)
MCH: 32.4 pg (ref 26.0–34.0)
MCHC: 33.8 g/dL (ref 32.0–36.0)
MCV: 95.7 fL (ref 80.0–100.0)
Platelets: 156 10*3/uL (ref 150–440)
RBC: 4.03 MIL/uL (ref 3.80–5.20)
RDW: 13.7 % (ref 11.5–14.5)
WBC: 3.8 10*3/uL (ref 3.6–11.0)

## 2017-06-04 LAB — TROPONIN I: TROPONIN I: 0.04 ng/mL — AB (ref <0.03)

## 2017-06-04 LAB — URINE CULTURE: Culture: <10000 — AB

## 2017-06-04 MED ORDER — ADULT MULTIVITAMIN W/MINERALS CH
1.0000 | ORAL_TABLET | Freq: Every day | ORAL | Status: DC
  Administered 2017-06-05: 1 via ORAL
  Filled 2017-06-04: qty 1

## 2017-06-04 MED ORDER — OSELTAMIVIR PHOSPHATE 30 MG PO CAPS
30.0000 mg | ORAL_CAPSULE | Freq: Two times a day (BID) | ORAL | Status: DC
  Administered 2017-06-04 – 2017-06-05 (×2): 30 mg via ORAL
  Filled 2017-06-04 (×3): qty 1

## 2017-06-04 MED ORDER — ENOXAPARIN SODIUM 30 MG/0.3ML ~~LOC~~ SOLN
30.0000 mg | SUBCUTANEOUS | Status: DC
  Administered 2017-06-04: 30 mg via SUBCUTANEOUS
  Filled 2017-06-04: qty 0.3

## 2017-06-04 MED ORDER — ENOXAPARIN SODIUM 40 MG/0.4ML ~~LOC~~ SOLN: 40.0000 mg | SUBCUTANEOUS | Status: DC

## 2017-06-04 MED ORDER — ENSURE ENLIVE PO LIQD: 237.0000 mL | Freq: Two times a day (BID) | ORAL | Status: DC

## 2017-06-04 MED ORDER — GUAIFENESIN-DM 100-10 MG/5ML PO SYRP
5.0000 mL | ORAL_SOLUTION | ORAL | Status: DC | PRN
  Administered 2017-06-04: 5 mL via ORAL
  Filled 2017-06-04: qty 5

## 2017-06-04 NOTE — Progress Notes (Signed)
[facility] at [place] NAME: Darlene Paul    MR#:  [id number]  DATE OF BIRTH:  1921-10-10  SUBJECTIVE:  CHIEF COMPLAINT: Patient is feeling terrible reporting body aches and weakness. Patient is very hard of hearing.  Reports intermittent episodes of cough granddaughter at bedside.  REVIEW OF SYSTEMS:  CONSTITUTIONAL: No fever, fatigue or weakness.  EYES: No blurred or double vision.  EARS, NOSE, AND THROAT: No tinnitus or ear pain.  RESPIRATORY: Reporting cough, exertional shortness of breath, denies wheezing or hemoptysis.  CARDIOVASCULAR: No chest pain, orthopnea, edema.  GASTROINTESTINAL: No nausea, vomiting, diarrhea or abdominal pain.  GENITOURINARY: No dysuria, hematuria.  ENDOCRINE: No polyuria, nocturia,  HEMATOLOGY: No anemia, easy bruising or bleeding SKIN: No rash or lesion. MUSCULOSKELETAL: No joint pain or arthritis.   NEUROLOGIC: No tingling, numbness, weakness.  PSYCHIATRY: No anxiety or depression.   DRUG ALLERGIES:   Allergies  Allergen Reactions  . Prednisolone     Rash   . Azithromycin Palpitations  . Sulfa Antibiotics Rash    VITALS:  Blood pressure (!) 121/57, pulse 77, temperature 97.7 F (36.5 C), temperature source Oral, resp. rate 18, height 5\' 2"  (1.575 m), weight 54.2 kg (119 lb 7.8 oz), SpO2 100 %.  PHYSICAL EXAMINATION:  GENERAL:  84 y.o.-year-old patient lying in the bed with no acute distress.  EYES: Pupils equal, round, reactive to light and accommodation. No scleral icterus. Extraocular muscles intact.  HEENT: Head atraumatic, normocephalic. Oropharynx and nasopharynx clear.  NECK:  Supple, no jugular venous distention. No thyroid enlargement, no tenderness.  LUNGS:  moderate breath sounds bilaterally, no wheezing, rales,rhonchi or crepitation. No use of accessory muscles of respiration.  CARDIOVASCULAR: S1, S2 normal. No murmurs, rubs, or gallops.  ABDOMEN: Soft, nontender, nondistended.  Bowel sounds present. No organomegaly or mass.  EXTREMITIES: No pedal edema, cyanosis, or clubbing.  NEUROLOGIC: Patient is awake and alert .cranial nerves grossly intact and generalized weakness in all extremities  pSYCHIATRIC: The patient is alert and oriented x 2-3.  SKIN: No obvious rash, lesion, or ulcer.    LABORATORY PANEL:   CBC Recent Labs  Lab 06/04/17 0222  WBC 3.8  HGB 13.0  HCT 38.5  PLT 156   ------------------------------------------------------------------------------------------------------------------  Chemistries  Recent Labs  Lab 06/03/17 0949  06/04/17 0222  NA 141  --  140  K 2.6*   < > 4.0  CL 101  --  109  CO2 33*  --  28  GLUCOSE 93  --  77  BUN 13  --  9  CREATININE 0.93  --  0.60  CALCIUM 8.6*  --  8.3*  AST 24  --   --   ALT 10*  --   --   ALKPHOS 59  --   --   BILITOT 0.7  --   --    < > = values in this interval not displayed.   ------------------------------------------------------------------------------------------------------------------  Cardiac Enzymes Recent Labs  Lab 06/04/17 0222  TROPONINI 0.04*   ------------------------------------------------------------------------------------------------------------------  RADIOLOGY:  Ct Head Wo Contrast  Result Date: 06/03/2017 CLINICAL DATA:  Weakness, altered level of consciousness EXAM: CT HEAD WITHOUT CONTRAST TECHNIQUE: Contiguous axial images were obtained from the base of the skull through the vertex without intravenous contrast. COMPARISON:  03/25/2017 FINDINGS: Brain: There is atrophy and chronic small vessel disease changes. Chronic calcification in the left basal ganglia, unchanged. No acute intracranial abnormality. Specifically, no hemorrhage, hydrocephalus, mass lesion, acute infarction, or significant  intracranial injury. Vascular: No hyperdense vessel or unexpected calcification. Skull: No acute calvarial abnormality. Postop craniectomy changes in the right frontal bone,  unchanged. Sinuses/Orbits: Mucosal thickening in the paranasal sinuses. Mastoid air cells are clear. Orbital soft tissues unremarkable. Other: None IMPRESSION: No acute intracranial abnormality. Atrophy, chronic microvascular disease. Electronically Signed   By: Rolm Baptise M.D.   On: 06/03/2017 11:19   Dg Chest Port 1 View  Result Date: 06/03/2017 CLINICAL DATA:  Cough.  Hypertension. EXAM: PORTABLE CHEST 1 VIEW COMPARISON:  October 07, 2015 FINDINGS: There is mild scarring in the lower lobe regions. There is no edema or consolidation. The heart size and pulmonary vascularity are normal. No adenopathy. There is aortic atherosclerosis. Bones are osteoporotic. IMPRESSION: Mild bibasilar scarring. No edema or consolidation. Stable cardiac silhouette. There is aortic atherosclerosis. Bones are osteoporotic. Aortic Atherosclerosis (ICD10-I70.0). Electronically Signed   By: Lowella Grip III M.D.   On: 06/03/2017 10:34    EKG:   Orders placed or performed during the hospital encounter of 06/03/17  . ED EKG 12-Lead  . ED EKG 12-Lead  . EKG 12-Lead  . EKG 12-Lead  . EKG 12-Lead  . EKG 12-Lead    ASSESSMENT AND PLAN:    Darlene Paul  is a 84 y.o. female with a known history of arthritis, dementia, hard of hearing, macular degeneration, hypothyroidism, hypertension presents to hospital secondary to worsening weakness, cough and shortness of breath.  1.  Influenza illness-exposed to daughter who was sick recently. -Droplet precautions.   -Wean oxygen as tolerated, currently requiring 1-2 L O2 -On Tamiflu -Lactic acid in the normal range  2.  Weakness-secondary to influenza and hypokalemia.   -Replaced potassium and  potassium at 4.0 today  3.  Elevated troponin-likely demand ischemia.  Patient denies any cardiac symptoms Troponin 0 0.05-0.05-0.04  4.  Dementia and parkinsonian symptoms-continue Sinemet and Aricept  5.  GERD-on Protonix now  6.  DVT prophylaxis-on  Lovenox  Physical therapy -recommending home health PT      All the records are reviewed and case discussed with Care Management/Social Workerr. Management plans discussed with the patient,  granddaughter at bedside and they are in agreement.  CODE STATUS: DNR  TOTAL TIME TAKING CARE OF THIS PATIENT: 36 minutes.   POSSIBLE D/C IN 1-2 DAYS, DEPENDING ON CLINICAL CONDITION.  Note: This dictation was prepared with Dragon dictation along with smaller phrase technology. Any transcriptional errors that result from this process are unintentional.   Nicholes Mango M.D on 06/04/2017 at 7:03 PM  Between 7am to 6pm - Pager - [phone] After 6pm go to [email or web address] - password [email or web address] [facility]  Office  [phone]  CC: Primary care physician; Idelle Crouch, MD

## 2017-06-04 NOTE — Progress Notes (Signed)
MEDICATION RELATED CONSULT NOTE - INITIAL   Pharmacy Consult for oseltamivir (Tamiflu)  Indication: Influenza A positive   Labs: Recent Labs    06/03/17 0949 06/04/17 0222  WBC 5.0 3.8  HGB 14.0 13.0  HCT 41.5 38.5  PLT 172 156  CREATININE 0.93 0.60  ALBUMIN 3.3*  --   PROT 6.6  --   AST 24  --   ALT 10*  --   ALKPHOS 59  --   BILITOT 0.7  --    Estimated Creatinine Clearance: 34.7 mL/min (by C-G formula based on SCr of 0.6 mg/dL).   Plan:  With improvement in SCr, will increase oseltamivir to 30 mg bid.   Napoleon Form, PharmD 06/04/2017,1:44 PM

## 2017-06-04 NOTE — Progress Notes (Signed)
Initial Nutrition Assessment  DOCUMENTATION CODES:   Non-severe (moderate) malnutrition in context of chronic illness  INTERVENTION:   Ensure Enlive po BID, each supplement provides 350 kcal and 20 grams of protein  MVI daily   Magic cup TID with meals, each supplement provides 290 kcal and 9 grams of protein  Dysphagia 3 diet  NUTRITION DIAGNOSIS:   Moderate Malnutrition related to chronic illness(breast cancer, parkinsons, dementia, advanced age ) as evidenced by mild fat depletion, moderate muscle depletion.  GOAL:   Patient will meet greater than or equal to 90% of their needs  MONITOR:   PO intake, Supplement acceptance, Labs, Weight trends, Skin, I & O's  REASON FOR ASSESSMENT:   Malnutrition Screening Tool    ASSESSMENT:   84 y.o. female with a known history of arthritis, dementia, hard of hearing, macular degeneration, hypothyroidism, hypertension presents to hospital secondary to worsening weakness, cough and shortness of breath. Pt admitted with flu   Visited pt's room today. Unable to obtain nutrition related history from pt r/t dementia so history obtained from family members at bedside. Per pt's family, pt with poor appetite and oral intake at baseline. Pt does drink vanilla Ensure at home. Pt with esophageal stricture; family reports occasional difficulty swallowing. Pt needs chopped meats. RD will order supplements and liberalize diet. Per family, pt's UBW is around 122lbs. Per family, pt has eaten only a few eggs in the past 3 days. Pt loves chicken nuggets per family.    Medications reviewed and include: aspirin, lovenox, melatonin, protonix  Labs reviewed: Ca 8.3(L)  NUTRITION - FOCUSED PHYSICAL EXAM:    Most Recent Value  Orbital Region  Mild depletion  Upper Arm Region  No depletion  Thoracic and Lumbar Region  Mild depletion  Buccal Region  Mild depletion  Temple Region  Moderate depletion  Clavicle Bone Region  Moderate depletion  Clavicle and  Acromion Bone Region  Moderate depletion  Scapular Bone Region  Moderate depletion  Dorsal Hand  Moderate depletion  Patellar Region  No depletion  Anterior Thigh Region  No depletion  Posterior Calf Region  No depletion  Edema (RD Assessment)  None  Hair  Reviewed  Eyes  Reviewed  Mouth  Reviewed  Skin  Reviewed  Nails  Reviewed     Diet Order:  Diet Heart Room service appropriate? Yes; Fluid consistency: Thin  EDUCATION NEEDS:   Education needs have been addressed(with family )  Skin: Reviewed RN Assessment  Last BM:  4/11- type 4  Height:   Ht Readings from Last 1 Encounters:  06/03/17 5\' 2"  (1.575 m)    Weight:   Wt Readings from Last 1 Encounters:  06/03/17 119 lb 7.8 oz (54.2 kg)    Ideal Body Weight:  50 kg  BMI:  Body mass index is 21.85 kg/m.  Estimated Nutritional Needs:   Kcal:  1200-1400kcal/day   Protein:  60-70g/day   Fluid:  >1.3L/day   Koleen Distance MS, RD, LDN Pager #[phone] After Hours Pager: [phone]

## 2017-06-04 NOTE — Progress Notes (Signed)
Pharmacy Electrolyte Monitoring Consult:  Pharmacy consulted to assist in monitoring and replacing electrolytes in this 84 y.o. female admitted on 06/03/2017 with Weakness   Labs:  Sodium (mmol/L)  Date Value  06/04/2017 140   Potassium (mmol/L)  Date Value  06/04/2017 4.0   Calcium (mg/dL)  Date Value  06/04/2017 8.3 (L)   Albumin (g/dL)  Date Value  06/03/2017 3.3 (L)    Plan: Electrolytes are WNL. Will f/u AM labs.  Pharmacy to continue to follow and replace electrolytes per protocol.   Napoleon Form, PharmD, BCPS Clinical Pharmacist 06/04/2017 1:49 PM

## 2017-06-04 NOTE — Evaluation (Signed)
Physical Therapy Evaluation Patient Details Name: Darlene Paul MRN: [id number] DOB: 21-Oct-1921 Today's Date: 06/04/2017   History of Present Illness  84 y.o. female with a known history of arthritis, dementia, hard of hearing, macular degeneration, hypothyroidism, hypertension presents to hospital secondary to worsening weakness, cough and shortness of breath. Admitted with the flu.  Clinical Impression  Pt did relatively well with PT exam. She was on 3 liters O2 on arrival and sats were in the high 90s, they stayed >95% on room air with light bed side exercises and ~40 ft of gait training with walker apart from the exam.  Son-in-law present and surprised she did as well as she did and was pleased to see that going home with HHPT is a safe and reasonable idea.      Follow Up Recommendations Home health PT    Equipment Recommendations       Recommendations for Other Services       Precautions / Restrictions Precautions Precautions: Fall Restrictions Weight Bearing Restrictions: No      Mobility  Bed Mobility Overal bed mobility: Needs Assistance Bed Mobility: Supine to Sit     Supine to sit: Min assist     General bed mobility comments: needed assist to get LEs to EOB/initiate movement, she was then able to scoot  Transfers Overall transfer level: Needs assistance Equipment used: Rolling walker (2 wheeled) Transfers: Sit to/from Stand Sit to Stand: Min assist         General transfer comment: Pt again needed some cuing to initiate movement, but needed very little assist to rise  Ambulation/Gait Ambulation/Gait assistance: Modified independent (Device/Increase time) Ambulation Distance (Feet): 40 Feet Assistive device: Rolling walker (2 wheeled)       General Gait Details: Pt with very slow, shuffling gait, but overall was safe and confident.  She had no LOBs, O2 on room air stayed in the high 90s.   Stairs            Wheelchair Mobility    Modified  Rankin (Stroke Patients Only)       Balance Overall balance assessment: Modified Independent                                           Pertinent Vitals/Pain Pain Assessment: No/denies pain    Home Living Family/patient expects to be discharged to:: Private residence Living Arrangements: Children Available Help at Discharge: Family;Available 24 hours/day   Home Access: Stairs to enter   Entrance Stairs-Number of Steps: 3   Home Equipment: Cane - single point;Walker - 2 wheels(halls are narrow, rarely uses walker)      Prior Function Level of Independence: Needs assistance   Gait / Transfers Assistance Needed: Pt essentially only walking in home distances (to/from the bathroom, room to room) family memeber always with her           Hand Dominance        Extremity/Trunk Assessment   Upper Extremity Assessment Upper Extremity Assessment: Generalized weakness(age appropriate limitations)    Lower Extremity Assessment Lower Extremity Assessment: Generalized weakness(age appropriate limitations)       Communication   Communication: HOH(dementia is limiting)  Cognition Arousal/Alertness: Awake/alert Behavior During Therapy: Flat affect Overall Cognitive Status: History of cognitive impairments - at baseline  General Comments      Exercises     Assessment/Plan    PT Assessment Patient needs continued PT services  PT Problem List Decreased strength;Decreased range of motion;Decreased activity tolerance;Decreased balance;Decreased mobility;Decreased coordination;Decreased cognition;Decreased knowledge of use of DME;Decreased safety awareness       PT Treatment Interventions Gait training;Therapeutic exercise;DME instruction;Functional mobility training;Therapeutic activities;Balance training;Neuromuscular re-education;Cognitive remediation;Patient/family education    PT Goals (Current  goals can be found in the Care Plan section)  Acute Rehab PT Goals Patient Stated Goal: go home PT Goal Formulation: With family Time For Goal Achievement: 06/18/17 Potential to Achieve Goals: Good    Frequency Min 2X/week   Barriers to discharge        Co-evaluation               AM-PAC PT "6 Clicks" Daily Activity  Outcome Measure Difficulty turning over in bed (including adjusting bedclothes, sheets and blankets)?: A Little Difficulty moving from lying on back to sitting on the side of the bed? : Unable Difficulty sitting down on and standing up from a chair with arms (e.g., wheelchair, bedside commode, etc,.)?: Unable Help needed moving to and from a bed to chair (including a wheelchair)?: A Little Help needed walking in hospital room?: A Little Help needed climbing 3-5 steps with a railing? : A Lot 6 Click Score: 13    End of Session Equipment Utilized During Treatment: Gait belt Activity Tolerance: Patient tolerated treatment well Patient left: with chair alarm set;with call bell/phone within reach;with family/visitor present   PT Visit Diagnosis: Muscle weakness (generalized) (M62.81);Difficulty in walking, not elsewhere classified (R26.2)    Time: 7829-5621 PT Time Calculation (min) (ACUTE ONLY): 28 min   Charges:   PT Evaluation $PT Eval Low Complexity: 1 Low PT Treatments $Gait Training: 8-22 mins   PT G Codes:        Kreg Shropshire, DPT 06/04/2017, 5:31 PM

## 2017-06-05 LAB — BASIC METABOLIC PANEL
ANION GAP: 6 (ref 5–15)
BUN: 8 mg/dL (ref 6–20)
CO2: 26 mmol/L (ref 22–32)
Calcium: 8.5 mg/dL — ABNORMAL LOW (ref 8.9–10.3)
Chloride: 104 mmol/L (ref 101–111)
Creatinine, Ser: 0.62 mg/dL (ref 0.44–1.00)
GFR calc Af Amer: >60 mL/min (ref >60)
GFR calc non Af Amer: >60 mL/min (ref >60)
Glucose, Bld: 85 mg/dL (ref 65–99)
POTASSIUM: 3.5 mmol/L (ref 3.5–5.1)
Sodium: 136 mmol/L (ref 135–145)

## 2017-06-05 LAB — MAGNESIUM: Magnesium: 1.8 mg/dL (ref 1.7–2.4)

## 2017-06-05 MED ORDER — OSELTAMIVIR PHOSPHATE 30 MG PO CAPS: 30.0000 mg | ORAL_CAPSULE | Freq: Two times a day (BID) | ORAL | 0 refills | Status: AC

## 2017-06-05 MED ORDER — ENSURE ENLIVE PO LIQD: 237.0000 mL | Freq: Two times a day (BID) | ORAL | 12 refills | Status: AC

## 2017-06-05 MED ORDER — GUAIFENESIN-DM 100-10 MG/5ML PO SYRP: 5.0000 mL | ORAL_SOLUTION | ORAL | 0 refills | Status: AC | PRN

## 2017-06-05 MED ORDER — ADULT MULTIVITAMIN W/MINERALS CH: 1.0000 | ORAL_TABLET | Freq: Every day | ORAL | 0 refills | Status: AC

## 2017-06-05 MED ORDER — ENOXAPARIN SODIUM 40 MG/0.4ML ~~LOC~~ SOLN: 40.0000 mg | SUBCUTANEOUS | Status: DC

## 2017-06-05 NOTE — Discharge Summary (Signed)
[name] NAME: Darlene Paul    MR#:  [id number]  DATE OF BIRTH:  03/01/1921  DATE OF ADMISSION:  06/03/2017 ADMITTING PHYSICIAN: Darlene Lighter, MD  DATE OF DISCHARGE: No discharge date for patient encounter.  PRIMARY CARE PHYSICIAN: Darlene Crouch, MD    ADMISSION DIAGNOSIS:  Hypokalemia [E87.6] Weakness [R53.1] Influenza A [J10.1] Hypoxia [R09.02] Elevated troponin I level [R74.8]  DISCHARGE DIAGNOSIS:  Active Problems:   Influenza   Malnutrition of moderate degree   SECONDARY DIAGNOSIS:   Past Medical History:  Diagnosis Date  . Alopecia of scalp   . Anemia   . Arthritis   . Cancer ([place]) 1992   right breast  . Complication of anesthesia    rash on left arm after surgery  . Dementia   . GERD (gastroesophageal reflux disease)   . H/O hiatal hernia   . Hard of hearing   . Hypercholesterolemia   . Hypertension   . Hypothyroidism    "nodules x 3"- s/p Radioactive Iodine Therapy  . Macular degeneration of both eyes    injection  right eye 05/31/12  . Melanoma of forehead ([place])    Removed previously  . Nervous   . Osteoporosis   . Paget disease of bone     HOSPITAL COURSE:  Darlene Paul a known history of arthritis, dementia, hard of hearing, macular degeneration, hypothyroidism, hypertension presents to hospital secondary to worsening weakness, cough and shortness of breath.  1.Influenza illness Resolving  Successfully weaned off oxygen, continue Tamiflu with supportive care   2. Weakness Stable secondary to influenza and hypokalemia.  Home health PT status post discharge  3. Elevated troponin likely demand ischemia Inconsistent with acute coronary syndrome  4. Dementia and parkinsonian symptoms Stable continued Sinemet and Aricept  5. GERD Stable on Protonix  DISCHARGE CONDITIONS:  On the day of discharge patient is afebrile,  hemodynamically stable, tolerating diet, patient to be discharged in the care of family with appropriate follow-up, for more specific details please see chart, home health services status post discharge  CONSULTS OBTAINED:    DRUG ALLERGIES:   Allergies  Allergen Reactions  . Prednisolone     Rash   . Azithromycin Palpitations  . Sulfa Antibiotics Rash    DISCHARGE MEDICATIONS:   Allergies as of 06/05/2017      Reactions   Prednisolone    Rash    Azithromycin Palpitations   Sulfa Antibiotics Rash      Medication List    TAKE these medications   acetaminophen 500 MG tablet Commonly known as:  TYLENOL Take 500 mg by mouth every 6 (six) hours as needed for moderate pain or headache.   amLODipine 5 MG tablet Commonly known as:  NORVASC Take 5 mg by mouth daily.   aspirin 81 MG tablet Take 81 mg by mouth daily.   Carbidopa-Levodopa ER 25-100 MG tablet controlled release Commonly known as:  SINEMET CR Take 1.5 tablets by mouth 3 (three) times daily.   donepezil 10 MG tablet Commonly known as:  ARICEPT Take 10 mg by mouth daily.   feeding supplement (ENSURE ENLIVE) Liqd Take 237 mLs by mouth 2 (two) times daily between meals.   guaiFENesin-dextromethorphan 100-10 MG/5ML syrup Commonly known as:  ROBITUSSIN DM Take 5 mLs by mouth every 4 (four) hours as needed for cough.   Melatonin 3 MG Tabs Take 6 mg by mouth at bedtime.   multivitamin with minerals Tabs tablet  Take 1 tablet by mouth daily. Start taking on:  06/06/2017   omeprazole 20 MG capsule Commonly known as:  PRILOSEC Take 20 mg by mouth 2 (two) times daily.   oseltamivir 30 MG capsule Commonly known as:  TAMIFLU Take 1 capsule (30 mg total) by mouth 2 (two) times daily.   pravastatin 80 MG tablet Commonly known as:  PRAVACHOL Take 80 mg by mouth at bedtime.        DISCHARGE INSTRUCTIONS:   If you experience worsening of your admission symptoms, develop shortness of breath, life threatening  emergency, suicidal or homicidal thoughts you must seek medical attention immediately by calling 911 or calling your MD immediately  if symptoms less severe.  You Must read complete instructions/literature along with all the possible adverse reactions/side effects for all the Medicines you take and that have been prescribed to you. Take any new Medicines after you have completely understood and accept all the possible adverse reactions/side effects.   Please note  You were cared for by a hospitalist during your hospital stay. If you have any questions about your discharge medications or the care you received while you were in the hospital after you are discharged, you can call the unit and asked to speak with the hospitalist on call if the hospitalist that took care of you is not available. Once you are discharged, your primary care physician will handle any further medical issues. Please note that NO REFILLS for any discharge medications will be authorized once you are discharged, as it is imperative that you return to your primary care physician (or establish a relationship with a primary care physician if you do not have one) for your aftercare needs so that they can reassess your need for medications and monitor your lab values.    Today   CHIEF COMPLAINT:   Chief Complaint  Patient presents with  . Weakness    HISTORY OF PRESENT ILLNESS:  Darlene Paul  is a 84 y.o. female with a known history of arthritis, dementia, hard of hearing, macular degeneration, hypothyroidism, hypertension presents to hospital secondary to worsening weakness, cough and shortness of breath. Due to her dementia, most of her history is obtained from family at bedside.  At baseline patient is able to ambulate with a cane, interact with family members.  Symptoms started about a week ago, her daughter was sick with upper respiratory infection.  Patient started to have some cough shortness of breath, malaise and  weakness.  In the last day she has not been able to ambulate at all and family had to lift her to get to the bed.  Her influenza test is positive in the emergency room.  Chest x-ray otherwise negative.  Labs showing hypokalemia.   VITAL SIGNS:  Blood pressure 129/89, pulse 79, temperature 98.5 F (36.9 C), temperature source Oral, resp. rate 18, height 5\' 2"  (1.575 m), weight 54.2 kg (119 lb 7.8 oz), SpO2 93 %.  I/O:    Intake/Output Summary (Last 24 hours) at 06/05/2017 1239 Last data filed at 06/05/2017 0400 Gross per 24 hour  Intake 480 ml  Output 300 ml  Net 180 ml    PHYSICAL EXAMINATION:  GENERAL:  84 y.o.-year-old patient lying in the bed with no acute distress.  EYES: Pupils equal, round, reactive to light and accommodation. No scleral icterus. Extraocular muscles intact.  HEENT: Head atraumatic, normocephalic. Oropharynx and nasopharynx clear.  NECK:  Supple, no jugular venous distention. No thyroid enlargement, no tenderness.  LUNGS: Normal  breath sounds bilaterally, no wheezing, rales,rhonchi or crepitation. No use of accessory muscles of respiration.  CARDIOVASCULAR: S1, S2 normal. No murmurs, rubs, or gallops.  ABDOMEN: Soft, non-tender, non-distended. Bowel sounds present. No organomegaly or mass.  EXTREMITIES: No pedal edema, cyanosis, or clubbing.  NEUROLOGIC: Cranial nerves II through XII are intact. Muscle strength 5/5 in all extremities. Sensation intact. Gait not checked.  PSYCHIATRIC: The patient is alert and oriented x 3.  SKIN: No obvious rash, lesion, or ulcer.   DATA REVIEW:   CBC Recent Labs  Lab 06/04/17 0222  WBC 3.8  HGB 13.0  HCT 38.5  PLT 156    Chemistries  Recent Labs  Lab 06/03/17 0949  06/05/17 0500  NA 141   < > 136  K 2.6*   < > 3.5  CL 101   < > 104  CO2 33*   < > 26  GLUCOSE 93   < > 85  BUN 13   < > 8  CREATININE 0.93   < > 0.62  CALCIUM 8.6*   < > 8.5*  MG  --   --  1.8  AST 24  --   --   ALT 10*  --   --   ALKPHOS 59   --   --   BILITOT 0.7  --   --    < > = values in this interval not displayed.    Cardiac Enzymes Recent Labs  Lab 06/04/17 0222  TROPONINI 0.04*    Microbiology Results  Results for orders placed or performed during the hospital encounter of 06/03/17  Urine culture     Status: Abnormal   Collection Time: 06/03/17  9:42 AM  Result Value Ref Range Status   Specimen Description   Final    URINE, RANDOM Performed at [facility], [address], [place], [place] [address]    Special Requests   Final    NONE Performed at [facility], [address], [place], [place] [address]    Culture (A)  Final    <10,000 COLONIES/mL INSIGNIFICANT GROWTH Performed at [facility] [address] [place], [place] [address]    Report Status 06/04/2017 FINAL  Final  Blood Culture (routine x 2)     Status: None (Preliminary result)   Collection Time: 06/03/17  9:49 AM  Result Value Ref Range Status   Specimen Description BLOOD RIGHT HAND  Final   Special Requests   Final    BOTTLES DRAWN AEROBIC AND ANAEROBIC Blood Culture results may not be optimal due to an inadequate volume of blood received in culture bottles   Culture   Final    NO GROWTH 2 DAYS Performed at [facility], [address] [place], [place] [address]    Report Status PENDING  Incomplete  Blood Culture (routine x 2)     Status: None (Preliminary result)   Collection Time: 06/03/17 10:01 AM  Result Value Ref Range Status   Specimen Description BLOOD RIGHT HAND  Final   Special Requests   Final    BOTTLES DRAWN AEROBIC AND ANAEROBIC Blood Culture results may not be optimal due to an inadequate volume of blood received in culture bottles   Culture   Final    NO GROWTH 2 DAYS Performed at [facility], [address], [place], [place] [address]    Report Status PENDING  Incomplete    RADIOLOGY:  No results found.  EKG:   Orders  placed or performed during the hospital  encounter of 06/03/17  . ED EKG 12-Lead  . ED EKG 12-Lead  . EKG 12-Lead  . EKG 12-Lead  . EKG 12-Lead  . EKG 12-Lead      Management plans discussed with the patient, family and they are in agreement.  CODE STATUS:     Code Status Orders  (From admission, onward)        Start     Ordered   06/03/17 1342  Do not attempt resuscitation (DNR)  Continuous    Question Answer Comment  In the event of cardiac or respiratory ARREST Do not call a "code blue"   In the event of cardiac or respiratory ARREST Do not perform Intubation, CPR, defibrillation or ACLS   In the event of cardiac or respiratory ARREST Use medication by any route, position, wound care, and other measures to relive pain and suffering. May use oxygen, suction and manual treatment of airway obstruction as needed for comfort.      06/03/17 1341    Code Status History    Date Active Date Inactive Code Status Order ID Comments User Context   11/01/2015 0002 11/03/2015 1753 Full Code [id number]  Etta Quill, DO Inpatient    Advance Directive Documentation     Most Recent Value  Type of Advance Directive  Healthcare Power of [place], Living will  Pre-existing out of facility DNR order (yellow form or pink MOST form)  -  "MOST" Form in Place?  -      TOTAL TIME TAKING CARE OF THIS PATIENT: 45 minutes.    Avel Peace Salary M.D on 06/05/2017 at 12:39 PM  Between 7am to 6pm - Pager - [phone]  After 6pm go to [email or web address] - password [email or web address] [facility]  Office  [phone]  CC: Primary care physician; Darlene Crouch, MD   Note: This dictation was prepared with Dragon dictation along with smaller phrase technology. Any transcriptional errors that result from this process are unintentional.

## 2017-06-05 NOTE — Progress Notes (Signed)
Pharmacy Lovenox Dosing  84 y.o. female admitted with Weakness . Patient ordered Lovenox 30 mg daily for VTE prophylaxis.   Filed Weights   06/03/17 0950 06/03/17 1715  Weight: 122 lb (55.3 kg) 119 lb 7.8 oz (54.2 kg)    Body mass index is 21.85 kg/m.  Estimated Creatinine Clearance: 34.7 mL/min (by C-G formula based on SCr of 0.62 mg/dL).  Will adjust Lovenox dosing to 40 mg daily.   Ulice Dash D 06/05/2017 10:01 AM

## 2017-06-05 NOTE — Care Management (Signed)
Patient admitted for flu.  Patient lives at home with children.  Patient has RW and cane in the home.  PT has assessed patient and recommends home health PT.  Per daughter Hassan Rowan it was attempted to arranged home health services through [facility] on an outpatient basis and would like for it to be set up through them. Referral made to [facility] with [place]. Patient weaned to RA. RNCM signing off.

## 2017-06-05 NOTE — Progress Notes (Signed)
Pharmacy Electrolyte Monitoring Consult:  Pharmacy consulted to assist in monitoring and replacing electrolytes in this 84 y.o. female admitted on 06/03/2017 with Weakness   Labs:  Sodium (mmol/L)  Date Value  06/05/2017 136   Potassium (mmol/L)  Date Value  06/05/2017 3.5   Magnesium (mg/dL)  Date Value  06/05/2017 1.8   Calcium (mg/dL)  Date Value  06/05/2017 8.5 (L)   Albumin (g/dL)  Date Value  06/03/2017 3.3 (L)    Plan: Electrolytes are WNL. Will f/u AM labs.  Pharmacy to continue to follow and replace electrolytes per protocol.   Napoleon Form, PharmD, BCPS Clinical Pharmacist 06/05/2017 9:29 AM

## 2017-06-05 NOTE — Progress Notes (Signed)
Darlene Paul to be D/C'd home per MD order.  Discussed prescriptions and follow up appointments with the patient. Prescriptions given to patient, medication list explained in detail. Pt verbalized understanding.  Allergies as of 06/05/2017      Reactions   Prednisolone    Rash    Azithromycin Palpitations   Sulfa Antibiotics Rash      Medication List    TAKE these medications   acetaminophen 500 MG tablet Commonly known as:  TYLENOL Take 500 mg by mouth every 6 (six) hours as needed for moderate pain or headache.   amLODipine 5 MG tablet Commonly known as:  NORVASC Take 5 mg by mouth daily.   aspirin 81 MG tablet Take 81 mg by mouth daily.   Carbidopa-Levodopa ER 25-100 MG tablet controlled release Commonly known as:  SINEMET CR Take 1.5 tablets by mouth 3 (three) times daily.   donepezil 10 MG tablet Commonly known as:  ARICEPT Take 10 mg by mouth daily.   feeding supplement (ENSURE ENLIVE) Liqd Take 237 mLs by mouth 2 (two) times daily between meals.   guaiFENesin-dextromethorphan 100-10 MG/5ML syrup Commonly known as:  ROBITUSSIN DM Take 5 mLs by mouth every 4 (four) hours as needed for cough.   Melatonin 3 MG Tabs Take 6 mg by mouth at bedtime.   multivitamin with minerals Tabs tablet Take 1 tablet by mouth daily. Start taking on:  06/06/2017   omeprazole 20 MG capsule Commonly known as:  PRILOSEC Take 20 mg by mouth 2 (two) times daily.   oseltamivir 30 MG capsule Commonly known as:  TAMIFLU Take 1 capsule (30 mg total) by mouth 2 (two) times daily.   pravastatin 80 MG tablet Commonly known as:  PRAVACHOL Take 80 mg by mouth at bedtime.       Vitals:   06/05/17 0601 06/05/17 1212  BP: 124/82 129/89  Pulse: 77 79  Resp: 18 18  Temp: 98.4 F (36.9 C) 98.5 F (36.9 C)  SpO2: 93% 93%    Skin clean, dry and intact without evidence of skin break down, no evidence of skin tears noted. IV catheter discontinued intact. Site without signs and  symptoms of complications. Dressing and pressure applied. Pt denies pain at this time. No complaints noted.  An After Visit Summary was printed and given to the patient. Patient escorted via [place], and D/C home via private auto.  Darlene Paul

## 2017-06-08 LAB — CULTURE, BLOOD (ROUTINE X 2)
CULTURE: NO GROWTH
Culture: NO GROWTH

## 2017-06-11 LAB — BLOOD GAS, VENOUS
Acid-Base Excess: 6 mmol/L — ABNORMAL HIGH (ref 0.0–2.0)
Bicarbonate: 31.2 mmol/L — ABNORMAL HIGH (ref 20.0–28.0)
O2 SAT: 70 %
PCO2 VEN: 46 mmHg (ref 44.0–60.0)
PH VEN: 7.44 — AB (ref 7.250–7.430)
Patient temperature: 37
pO2, Ven: 35 mmHg (ref 32.0–45.0)

## 2017-11-24 DEATH — deceased
